# Patient Record
Sex: Male | Born: 1960 | Race: White | Hispanic: No | Marital: Married | Smoking: Never smoker
Health system: Southern US, Community
[De-identification: ages and names within clinical notes are randomized; demographics above are authoritative.]

## PROBLEM LIST (undated history)

## (undated) DIAGNOSIS — I2699 Other pulmonary embolism without acute cor pulmonale: Secondary | ICD-10-CM

## (undated) DIAGNOSIS — F32A Depression, unspecified: Secondary | ICD-10-CM

## (undated) DIAGNOSIS — I2510 Atherosclerotic heart disease of native coronary artery without angina pectoris: Secondary | ICD-10-CM

## (undated) DIAGNOSIS — G2581 Restless legs syndrome: Secondary | ICD-10-CM

## (undated) HISTORY — DX: Other pulmonary embolism without acute cor pulmonale: I26.99

## (undated) HISTORY — DX: Atherosclerotic heart disease of native coronary artery without angina pectoris: I25.10

---

## 2012-04-15 DIAGNOSIS — F419 Anxiety disorder, unspecified: Secondary | ICD-10-CM | POA: Diagnosis present

## 2014-03-23 DIAGNOSIS — Z832 Family history of diseases of the blood and blood-forming organs and certain disorders involving the immune mechanism: Secondary | ICD-10-CM

## 2015-05-10 DIAGNOSIS — G609 Hereditary and idiopathic neuropathy, unspecified: Secondary | ICD-10-CM | POA: Diagnosis present

## 2017-08-01 DIAGNOSIS — D126 Benign neoplasm of colon, unspecified: Secondary | ICD-10-CM | POA: Insufficient documentation

## 2018-07-25 DIAGNOSIS — E785 Hyperlipidemia, unspecified: Secondary | ICD-10-CM | POA: Diagnosis present

## 2020-02-10 DIAGNOSIS — N400 Enlarged prostate without lower urinary tract symptoms: Secondary | ICD-10-CM | POA: Insufficient documentation

## 2021-09-21 ENCOUNTER — Encounter
Admission: EM | Disposition: A | Discharge status: Home / Self Care | Payer: Self-pay | Source: Home / Self Care | Attending: Osteopathic Medicine

## 2021-09-21 ENCOUNTER — Inpatient Hospital Stay
Admission: EM | Admit: 2021-09-21 | Discharge: 2021-09-22 | DRG: 164 | Disposition: A | Discharge status: Home / Self Care | Payer: BC Managed Care – PPO | Attending: Internal Medicine | Admitting: Internal Medicine

## 2021-09-21 ENCOUNTER — Emergency Department: Payer: BC Managed Care – PPO

## 2021-09-21 ENCOUNTER — Encounter: Payer: Self-pay | Admitting: Emergency Medicine

## 2021-09-21 ENCOUNTER — Other Ambulatory Visit: Payer: Self-pay

## 2021-09-21 DIAGNOSIS — F419 Anxiety disorder, unspecified: Secondary | ICD-10-CM | POA: Diagnosis present

## 2021-09-21 DIAGNOSIS — I2602 Saddle embolus of pulmonary artery with acute cor pulmonale: Principal | ICD-10-CM | POA: Diagnosis present

## 2021-09-21 DIAGNOSIS — R778 Other specified abnormalities of plasma proteins: Secondary | ICD-10-CM

## 2021-09-21 DIAGNOSIS — I2699 Other pulmonary embolism without acute cor pulmonale: Secondary | ICD-10-CM | POA: Diagnosis not present

## 2021-09-21 DIAGNOSIS — Z79899 Other long term (current) drug therapy: Secondary | ICD-10-CM | POA: Diagnosis not present

## 2021-09-21 DIAGNOSIS — F3342 Major depressive disorder, recurrent, in full remission: Secondary | ICD-10-CM | POA: Diagnosis not present

## 2021-09-21 DIAGNOSIS — Z20822 Contact with and (suspected) exposure to covid-19: Secondary | ICD-10-CM | POA: Diagnosis present

## 2021-09-21 DIAGNOSIS — E875 Hyperkalemia: Secondary | ICD-10-CM

## 2021-09-21 DIAGNOSIS — Z832 Family history of diseases of the blood and blood-forming organs and certain disorders involving the immune mechanism: Secondary | ICD-10-CM | POA: Diagnosis not present

## 2021-09-21 DIAGNOSIS — R55 Syncope and collapse: Secondary | ICD-10-CM | POA: Diagnosis present

## 2021-09-21 DIAGNOSIS — E785 Hyperlipidemia, unspecified: Secondary | ICD-10-CM | POA: Diagnosis present

## 2021-09-21 DIAGNOSIS — G9009 Other idiopathic peripheral autonomic neuropathy: Secondary | ICD-10-CM

## 2021-09-21 DIAGNOSIS — G608 Other hereditary and idiopathic neuropathies: Secondary | ICD-10-CM | POA: Diagnosis present

## 2021-09-21 DIAGNOSIS — G2581 Restless legs syndrome: Secondary | ICD-10-CM | POA: Diagnosis present

## 2021-09-21 DIAGNOSIS — R030 Elevated blood-pressure reading, without diagnosis of hypertension: Secondary | ICD-10-CM | POA: Diagnosis present

## 2021-09-21 DIAGNOSIS — I248 Other forms of acute ischemic heart disease: Secondary | ICD-10-CM | POA: Diagnosis present

## 2021-09-21 DIAGNOSIS — G609 Hereditary and idiopathic neuropathy, unspecified: Secondary | ICD-10-CM | POA: Diagnosis present

## 2021-09-21 DIAGNOSIS — R748 Abnormal levels of other serum enzymes: Secondary | ICD-10-CM

## 2021-09-21 DIAGNOSIS — F32A Depression, unspecified: Secondary | ICD-10-CM | POA: Diagnosis present

## 2021-09-21 HISTORY — DX: Restless legs syndrome: G25.81

## 2021-09-21 HISTORY — PX: PULMONARY THROMBECTOMY: CATH118295

## 2021-09-21 HISTORY — DX: Depression, unspecified: F32.A

## 2021-09-21 LAB — CBC
HCT: 42 % (ref 39.0–52.0)
Hemoglobin: 14.2 g/dL (ref 13.0–17.0)
MCH: 28.7 pg (ref 26.0–34.0)
MCHC: 33.8 g/dL (ref 30.0–36.0)
MCV: 85 fL (ref 80.0–100.0)
Platelets: 204 10*3/uL (ref 150–400)
RBC: 4.94 MIL/uL (ref 4.22–5.81)
RDW: 11.7 % (ref 11.5–15.5)
WBC: 11.5 10*3/uL — ABNORMAL HIGH (ref 4.0–10.5)
nRBC: 0 % (ref 0.0–0.2)

## 2021-09-21 LAB — HIV ANTIBODY (ROUTINE TESTING W REFLEX): HIV Screen 4th Generation wRfx: NONREACTIVE

## 2021-09-21 LAB — COMPREHENSIVE METABOLIC PANEL
ALT: 48 U/L — ABNORMAL HIGH (ref 0–44)
AST: 48 U/L — ABNORMAL HIGH (ref 15–41)
Albumin: 4.2 g/dL (ref 3.5–5.0)
Alkaline Phosphatase: 72 U/L (ref 38–126)
Anion gap: 8 (ref 5–15)
BUN: 15 mg/dL (ref 8–23)
CO2: 22 mmol/L (ref 22–32)
Calcium: 8.7 mg/dL — ABNORMAL LOW (ref 8.9–10.3)
Chloride: 108 mmol/L (ref 98–111)
Creatinine, Ser: 0.77 mg/dL (ref 0.61–1.24)
GFR, Estimated: >60 mL/min (ref >60)
Glucose, Bld: 128 mg/dL — ABNORMAL HIGH (ref 70–99)
Potassium: 4.3 mmol/L (ref 3.5–5.1)
Sodium: 138 mmol/L (ref 135–145)
Total Bilirubin: 0.9 mg/dL (ref 0.3–1.2)
Total Protein: 7.2 g/dL (ref 6.5–8.1)

## 2021-09-21 LAB — TROPONIN I (HIGH SENSITIVITY)
Troponin I (High Sensitivity): 317 ng/L (ref <18)
Troponin I (High Sensitivity): 1698 ng/L (ref <18)

## 2021-09-21 LAB — PROTIME-INR
INR: 1.1 (ref 0.8–1.2)
Prothrombin Time: 13.7 seconds (ref 11.4–15.2)

## 2021-09-21 LAB — RESP PANEL BY RT-PCR (FLU A&B, COVID) ARPGX2
Influenza A by PCR: NEGATIVE
Influenza B by PCR: NEGATIVE
SARS Coronavirus 2 by RT PCR: NEGATIVE

## 2021-09-21 LAB — APTT
aPTT: 22 seconds — ABNORMAL LOW (ref 24–36)
aPTT: 82 seconds — ABNORMAL HIGH (ref 24–36)

## 2021-09-21 LAB — HEPARIN LEVEL (UNFRACTIONATED): Heparin Unfractionated: 0.24 IU/mL — ABNORMAL LOW (ref 0.30–0.70)

## 2021-09-21 SURGERY — PULMONARY THROMBECTOMY
Anesthesia: Moderate Sedation | Laterality: Bilateral

## 2021-09-21 MED ORDER — SODIUM CHLORIDE 0.9 % IV BOLUS
1000.0000 mL | Freq: Once | INTRAVENOUS | Status: AC
Start: 2021-09-21 — End: 2021-09-21
  Administered 2021-09-21: 1000 mL via INTRAVENOUS

## 2021-09-21 MED ORDER — PAROXETINE HCL 30 MG PO TABS
30.0000 mg | ORAL_TABLET | Freq: Every day | ORAL | Status: DC
  Administered 2021-09-22: 30 mg via ORAL
  Filled 2021-09-21: qty 1

## 2021-09-21 MED ORDER — CEFAZOLIN SODIUM-DEXTROSE 2-4 GM/100ML-% IV SOLN
INTRAVENOUS | Status: AC
  Administered 2021-09-21: 2 g via INTRAVENOUS
  Filled 2021-09-21: qty 100

## 2021-09-21 MED ORDER — SODIUM CHLORIDE 0.9 % IV SOLN: INTRAVENOUS | Status: DC

## 2021-09-21 MED ORDER — IOHEXOL 350 MG/ML SOLN
75.0000 mL | Freq: Once | INTRAVENOUS | Status: AC | PRN
  Administered 2021-09-21: 75 mL via INTRAVENOUS

## 2021-09-21 MED ORDER — FENTANYL CITRATE (PF) 100 MCG/2ML IJ SOLN
INTRAMUSCULAR | Status: DC | PRN
  Administered 2021-09-21: 50 ug via INTRAVENOUS

## 2021-09-21 MED ORDER — MIDAZOLAM HCL 5 MG/5ML IJ SOLN
INTRAMUSCULAR | Status: AC
  Filled 2021-09-21: qty 5

## 2021-09-21 MED ORDER — MIDAZOLAM HCL 2 MG/2ML IJ SOLN
INTRAMUSCULAR | Status: DC | PRN
  Administered 2021-09-21: 2 mg via INTRAVENOUS

## 2021-09-21 MED ORDER — FENTANYL CITRATE PF 50 MCG/ML IJ SOSY
PREFILLED_SYRINGE | INTRAMUSCULAR | Status: AC
  Filled 2021-09-21: qty 1

## 2021-09-21 MED ORDER — HEPARIN BOLUS VIA INFUSION
6000.0000 [IU] | Freq: Once | INTRAVENOUS | Status: AC
  Administered 2021-09-21: 6000 [IU] via INTRAVENOUS
  Filled 2021-09-21: qty 6000

## 2021-09-21 MED ORDER — ALTEPLASE 2 MG IJ SOLR
INTRAMUSCULAR | Status: DC | PRN
  Administered 2021-09-21: 8 mg

## 2021-09-21 MED ORDER — PRAMIPEXOLE DIHYDROCHLORIDE 0.25 MG PO TABS
0.5000 mg | ORAL_TABLET | Freq: Every day | ORAL | Status: DC
  Administered 2021-09-21 – 2021-09-22 (×2): 0.5 mg via ORAL
  Filled 2021-09-21 (×2): qty 2

## 2021-09-21 MED ORDER — IODIXANOL 320 MG/ML IV SOLN
INTRAVENOUS | Status: DC | PRN
  Administered 2021-09-21: 70 mL

## 2021-09-21 MED ORDER — HEPARIN (PORCINE) 25000 UT/250ML-% IV SOLN
1650.0000 [IU]/h | INTRAVENOUS | Status: DC
  Administered 2021-09-21: 1600 [IU]/h via INTRAVENOUS
  Administered 2021-09-22: 1650 [IU]/h via INTRAVENOUS
  Filled 2021-09-21 (×2): qty 250

## 2021-09-21 MED ORDER — HEPARIN SODIUM (PORCINE) 1000 UNIT/ML IJ SOLN
INTRAMUSCULAR | Status: AC
  Filled 2021-09-21: qty 10

## 2021-09-21 MED ORDER — CEFAZOLIN SODIUM-DEXTROSE 2-4 GM/100ML-% IV SOLN: 2.0000 g | INTRAVENOUS | Status: DC

## 2021-09-21 MED ORDER — SODIUM CHLORIDE 0.9 % IV BOLUS
250.0000 mL | Freq: Once | INTRAVENOUS | Status: AC
  Administered 2021-09-21: 250 mL via INTRAVENOUS

## 2021-09-21 MED ORDER — PRAMIPEXOLE DIHYDROCHLORIDE 0.25 MG PO TABS
0.5000 mg | ORAL_TABLET | Freq: Every evening | ORAL | Status: DC | PRN
  Filled 2021-09-21: qty 2

## 2021-09-21 MED ORDER — ALTEPLASE 2 MG IJ SOLR
INTRAMUSCULAR | Status: AC
  Filled 2021-09-21: qty 8

## 2021-09-21 SURGICAL SUPPLY — 14 items
CANISTER PENUMBRA ENGINE (MISCELLANEOUS) ×1 IMPLANT
CATH INDIGO 12 HTORQ 115 (CATHETERS) ×1 IMPLANT
CATH INDIGO SEP 12 (CATHETERS) ×1 IMPLANT
CATH INFINITI JR4 5F (CATHETERS) ×1 IMPLANT
CATH SELECT BERN TIP 5F 130 (CATHETERS) ×1 IMPLANT
COVER PROBE U/S 5X48 (MISCELLANEOUS) ×1 IMPLANT
DEVICE TORQUE .025-.038 (MISCELLANEOUS) ×1 IMPLANT
GLIDEWIRE ADV .035X260CM (WIRE) ×1 IMPLANT
PACK ANGIOGRAPHY (CUSTOM PROCEDURE TRAY) ×2 IMPLANT
SHEATH BRITE TIP 8FRX11 (SHEATH) ×1 IMPLANT
SHEATH PINNACLE 11FRX10 (SHEATH) ×1 IMPLANT
SYR MEDRAD MARK 7 150ML (SYRINGE) ×1 IMPLANT
TUBING CONTRAST HIGH PRESS 72 (TUBING) ×1 IMPLANT
WIRE GUIDERIGHT .035X150 (WIRE) ×1 IMPLANT

## 2021-09-21 NOTE — Consult Note (Signed)
?[facility] ?Vascular Consult Note ? ?MRN : [id number] ? ?Roberto Pope is a 61 y.o. ([birth year]) male who presents with chief complaint of  ?Chief Complaint  ?Patient presents with  ? Near Syncope  ? Shortness of Breath  ?. ? ?History of Present Illness: Patient presents with near syncope and significant shortness of breath that occurred today.  He has been having some leg pain and thought it was a pulled muscle for about 3 to 4 days.  He reports mild shortness of breath over months but nothing severe until today when he began having significant chest pain and shortness of breath that was new.  He denies any bleeding issues.  He does have family members who have had pulmonary embolism blood clots in the past.  He denies any trauma or injury or inciting event.  I have independently reviewed his CT angiogram and he has a submassive pulmonary embolus with right heart strain, saddle embolus with involvement of both main pulmonary arteries and the primary lobar branches.  He remains tachycardic and mildly short of breath on supplemental oxygen ? ?Current Facility-Administered Medications  ?Medication Dose Route Frequency Provider Last Rate Last Admin  ? ceFAZolin (ANCEF) 2-4 GM/100ML-% IVPB           ? fentaNYL (SUBLIMAZE) 50 MCG/ML injection           ? heparin sodium (porcine) 1000 UNIT/ML injection           ? midazolam (VERSED) 5 MG/5ML injection           ? 0.9 %  sodium chloride infusion   Intravenous Continuous Aleksey Newbern, Erskine Squibb, MD      ? ceFAZolin (ANCEF) IVPB 2g/100 mL premix  2 g Intravenous 30 min Pre-Op Algernon Huxley, MD      ? heparin ADULT infusion 100 units/mL (25000 units/22m)  1,600 Units/hr Intravenous Continuous POswald Hillock RPH 16 mL/hr at 09/21/21 1435 1,600 Units/hr at 09/21/21 1435  ? [MAR Hold] PARoxetine (PAXIL) tablet 30 mg  30 mg Oral Daily AEmeterio Reeve DO      ? [MAR Hold] pramipexole (MIRAPEX) tablet 0.5 mg  0.5 mg Oral Daily AEmeterio Reeve DO   0.5  mg at 09/21/21 1607  ? [MAR Hold] pramipexole (MIRAPEX) tablet 0.5 mg  0.5 mg Oral QHS PRN AEmeterio Reeve DO      ? ? ?Past Medical History:  ?Diagnosis Date  ? Depression   ? Restless leg   ? ? ? ? ?Social History  ? ?Tobacco Use  ? Smoking status: Unknown  ?Substance Use Topics  ? Alcohol use: Not Currently  ? Drug use: Never  ? ? ? ?Family History  ?Problem Relation Age of Onset  ? Pulmonary embolism Mother   ? Pulmonary embolism Nephew   ?No AAA ?No bleeding disorders ? ?No Known Allergies ? ? ?REVIEW OF SYSTEMS (Negative unless checked) ? ?Constitutional: '[]'$ Weight loss  '[]'$ Fever  '[]'$ Chills ?Cardiac: '[]'$ Chest pain   '[]'$ Chest pressure   '[]'$ Palpitations   '[]'$ Shortness of breath when laying flat   '[x]'$ Shortness of breath at rest   '[x]'$ Shortness of breath with exertion. ?Vascular:  '[x]'$ Pain in legs with walking   '[]'$ Pain in legs at rest   '[]'$ Pain in legs when laying flat   '[]'$ Claudication   '[]'$ Pain in feet when walking  '[]'$ Pain in feet at rest  '[]'$ Pain in feet when laying flat   '[x]'$ History of DVT   '[]'$ Phlebitis   '[]'$ Swelling in legs   '[]'$ Varicose veins   '[]'$   Non-healing ulcers ?Pulmonary:   '[]'$ Uses home oxygen   '[]'$ Productive cough   '[]'$ Hemoptysis   '[]'$ Wheeze  '[]'$ COPD   '[]'$ Asthma ?Neurologic:  '[]'$ Dizziness  '[]'$ Blackouts   '[]'$ Seizures   '[]'$ History of stroke   '[]'$ History of TIA  '[]'$ Aphasia   '[]'$ Temporary blindness   '[]'$ Dysphagia   '[]'$ Weakness or numbness in arms   '[]'$ Weakness or numbness in legs ?Musculoskeletal:  '[]'$ Arthritis   '[]'$ Joint swelling   '[]'$ Joint pain   '[]'$ Low back pain ?Hematologic:  '[]'$ Easy bruising  '[]'$ Easy bleeding   '[]'$ Hypercoagulable state   '[]'$ Anemic  '[]'$ Hepatitis ?Gastrointestinal:  '[]'$ Blood in stool   '[]'$ Vomiting blood  '[]'$ Gastroesophageal reflux/heartburn   '[]'$ Difficulty swallowing. ?Genitourinary:  '[]'$ Chronic kidney disease   '[]'$ Difficult urination  '[]'$ Frequent urination  '[]'$ Burning with urination   '[]'$ Blood in urine ?Skin:  '[]'$ Rashes   '[]'$ Ulcers   '[]'$ Wounds ?Psychological:  '[x]'$ History of anxiety   '[]'$  History of major depression. ? ?Physical  Examination ? ?Vitals:  ? 09/21/21 1500 09/21/21 1530 09/21/21 1600 09/21/21 1633  ?BP: (!) 140/117 (!) 153/126 (!) 157/92 (!) 135/103  ?Pulse: (!) 122 (!) 124 (!) 146 (!) 125  ?Resp: (!) 25 19 (!) 31 (!) 26  ?Temp:    97.6 ?F (36.4 ?C)  ?TempSrc:    Oral  ?SpO2: 98% 98% 96% 96%  ?Weight:    101.6 kg  ?Height:    '6\' 2"'$  (1.88 m)  ? ?Body mass index is 28.76 kg/m?. ?Gen:  WD/WN, NAD ?Head: [place]/AT, No temporalis wasting.  ?Ear/Nose/Throat: Hearing grossly intact, nares w/o erythema or drainage, oropharynx w/o Erythema/Exudate ?Eyes: Sclera non-icteric, conjunctiva clear ?Neck: Trachea midline.  No JVD.  ?Pulmonary:  Good air movement, respirations mildly labored on supplemental oxygen ?Cardiac: tachycardic but regular ?Vascular:  ?Vessel Right Left  ?Radial Palpable Palpable  ? ?Musculoskeletal: M/S 5/5 throughout.  Extremities without ischemic changes.  No deformity or atrophy. No edema. ?Neurologic: Sensation grossly intact in extremities.  Symmetrical.  Speech is fluent. Motor exam as listed above. ?Psychiatric: Judgment intact, Mood & affect appropriate for pt's clinical situation. ?Dermatologic: No rashes or ulcers noted.  No cellulitis or open wounds. ? ? ? ? ? ?CBC ?Lab Results  ?Component Value Date  ? WBC 11.5 (H) 09/21/2021  ? HGB 14.2 09/21/2021  ? HCT 42.0 09/21/2021  ? MCV 85.0 09/21/2021  ? PLT 204 09/21/2021  ? ? ?BMET ?   ?Component Value Date/Time  ? NA 138 09/21/2021 1228  ? K 4.3 09/21/2021 1228  ? CL 108 09/21/2021 1228  ? CO2 22 09/21/2021 1228  ? GLUCOSE 128 (H) 09/21/2021 1228  ? BUN 15 09/21/2021 1228  ? CREATININE 0.77 09/21/2021 1228  ? CALCIUM 8.7 (L) 09/21/2021 1228  ? GFRNONAA >60 09/21/2021 1228  ? ?Estimated Creatinine Clearance: 121.9 mL/min (by C-G formula based on SCr of 0.77 mg/dL). ? ?COAG ?Lab Results  ?Component Value Date  ? INR 1.1 09/21/2021  ? ? ?Radiology ?CT Angio Chest PE W and/or Wo Contrast ? ?Result Date: 09/21/2021 ?CLINICAL DATA:  Dizziness and nausea with standing.  Back  pain. EXAM: CT ANGIOGRAPHY CHEST WITH CONTRAST TECHNIQUE: Multidetector CT imaging of the chest was performed using the standard protocol during bolus administration of intravenous contrast. Multiplanar CT image reconstructions and MIPs were obtained to evaluate the vascular anatomy. RADIATION DOSE REDUCTION: This exam was performed according to the departmental dose-optimization program which includes automated exposure control, adjustment of the mA and/or kV according to patient size and/or use of iterative reconstruction technique. CONTRAST:  60m OMNIPAQUE IOHEXOL 350 MG/ML  SOLN COMPARISON:  None. FINDINGS: Cardiovascular: Saddle embolus with extensive clot extending to the lobar, segmental and subsegmental levels bilaterally. RV/LV ratio of 2.6. Venous air, likely related to contrast injection. Heart is at the upper limits of normal in size. No pericardial effusion. Mediastinum/Nodes: Mediastinal lymph nodes measure up to 1.4 cm in the low right paratracheal station. No hilar or axillary adenopathy. Esophagus is grossly unremarkable. Lungs/Pleura: Rounded subpleural ground-glass and consolidation in the posterolateral left lower lobe. Minimal subpleural atelectasis in the lingula and left lower lobe. Trace left pleural effusion. Airway is unremarkable. Upper Abdomen: Visualized portions of the liver, adrenal glands, kidneys, spleen, pancreas, stomach and bowel are unremarkable with exception of a small hiatal hernia. Musculoskeletal: Degenerative changes in the spine. No worrisome lytic or sclerotic lesions. Review of the MIP images confirms the above findings. IMPRESSION: 1. Positive for acute PE with CT evidence of right heart strain (RV/LV Ratio = 2.6) consistent with at least submassive (intermediate risk) PE. The presence of right heart strain has been associated with an increased risk of morbidity and mortality. Please refer to the "PE Focused" order set in EPIC. Critical Value/emergent results were  called by telephone at the time of interpretation on 09/21/2021 at 1:56 pm to provider [facility] , who verbally acknowledged these results. 2. Small infarct in the posterolateral left lower lobe. 3. Trace

## 2021-09-21 NOTE — Assessment & Plan Note (Addendum)
Continue home medications with Paxil ?

## 2021-09-21 NOTE — Assessment & Plan Note (Signed)
Minimal, follow-up outpatient ?

## 2021-09-21 NOTE — H&P (Signed)
? ? ?HISTORY AND PHYSICAL ? ?Patient: Roberto Pope 61 y.o. male ?MRN: [id number] ? ?Today is hospital day 0 after presenting to ED on 09/21/2021 12:24 PM with  ?Chief Complaint  ?Patient presents with  ? Near Syncope  ? Shortness of Breath  ? ? ? ?RECORD REVIEW AND HOSPITAL COURSE: ?From ED notes 09/21/21 "Roberto Pope is a 61 y.o. male with a past medical history of depression who presents to the emergency department for back pain and shortness of breath.  According to the patient since Sunday he has been experiencing pain in his back worse when he takes a deep breath and a sensation like he cannot get a full deep breath of air.  He states today he was sitting and when he stood up he had a sharp pain in his back felt short of breath and felt lightheaded like he was going to pass out.  Patient was able to get himself seated once again and did not actually lose consciousness.  Did not hit his head.  Here the patient noted to be tachycardic around 130.  Borderline hypoxic in the upper 80s/low 90s placed on 2 L nasal cannula oxygen with no baseline O2 requirement.  Patient denies any recent cough or fever.  Denies any leg pain or swelling.  No history of cardiac disease or pulmonary emboli/DVT.  No blood thinners." ?Emergency department course 09/21/2021: Slightly elevated blood pressure, tachycardic, slight increased respiratory rate, afebrile, CBC slightly elevated 11.5, CT angiogram chest showed acute PE, submassive, saddle embolus, associated with right heart strain as well as small infarct in posterolateral left lower lobe and trace pleural effusion. ? ?Procedures and Significant Results:  ?CT angiogram chest 09/21/21 showed acute PE, submassive, saddle embolus, associated with right heart strain as well as small infarct in posterolateral left lower lobe and trace pleural effusion. ?Plan for OR today 09/21/21 w/ vascular surgery  ? ?Consultants:  ?Vascular surgery, Dr. Lucky Cowboy ? ? ? ?SUBJECTIVE:  ?Patient seen and  examined lying comfortably in bed in emergency department, no apparent distress.  He is in good spirits.  He confirms the above history.  He notes also that he and his wife were recently on a train ride to [place], returned back on March 25 (11 days ago), he notes that the return trip on the train was several hours longer than the trip there.  He has not noted any lower extremity swelling, he notes he has been rubbing his legs more often than usual so he may be a bit more sore, he has a history of idiopathic lower extremity neuropathy.  He reports family history of factor V Leiden but he states he has tested negative for this disorder in the past. ? ? ? ? ?ASSESSMENT & PLAN ? ?Pulmonary embolus and infarction [facility]) see CT 09/21/2021, (+)saddle embolus w/ R heart strain and LLL infarction ?Possibly provoked by recent travel, long train ride from [place] to [place] 11 days prior to presentation to the ED  ?Heparin bolus and GTT initiated in ED ?Vascular surgery to take today for thrombectomy ? ?Family history of factor V Leiden mutation ?Patient reports previously tested negative ?Family history of mother with PE at age 50, nephew with PE at age 34 ?Will repeat testing for this and other hypercoagulable disorders ? ?Anxiety ?Continue home medications with Paxil ? ?Idiopathic peripheral neuropathy ?Stable, continue home medications with pramipexole ? ?Hyperlipidemia ?No home meds, may contribute to cardiac risk  ?lipid panel in a.m. ? ?Elevated liver  enzymes ?Minimal, follow-up outpatient ? ? ? ?VTE Ppx: Currently on active treatment for PE ?CODE STATUS: FULL ?Admitted from: home ?Expected Dispo: home ?Barriers to discharge: Continued medical treatment for PE, vascular surgery planning for procedure ?Family communication: wife was at bedside in emergency department ? ? ? ? ? ? ? ? ? ? ? ? ? ?Past Medical History:  ?Diagnosis Date  ? Depression   ? Restless leg   ? ? ?Family History  ?Problem Relation  Age of Onset  ? Pulmonary embolism Mother   ? Pulmonary embolism Nephew   ? ?Social History:  has no history on file for tobacco use, alcohol use, and drug use. ? ?Allergies: No Known Allergies ? ?No current facility-administered medications on file prior to encounter.  ? ?Current Outpatient Medications on File Prior to Encounter  ?Medication Sig Dispense Refill  ? PARoxetine (PAXIL) 30 MG tablet Take 30 mg by mouth daily.    ? pramipexole (MIRAPEX) 0.5 MG tablet Take 0.5 mg by mouth daily after lunch.    ? ? ? ?Results for orders placed or performed during the hospital encounter of 09/21/21 (from the past 48 hour(s))  ?CBC     Status: Abnormal  ? Collection Time: 09/21/21 12:28 PM  ?Result Value Ref Range  ? WBC 11.5 (H) 4.0 - 10.5 K/uL  ? RBC 4.94 4.22 - 5.81 MIL/uL  ? Hemoglobin 14.2 13.0 - 17.0 g/dL  ? HCT 42.0 39.0 - 52.0 %  ? MCV 85.0 80.0 - 100.0 fL  ? MCH 28.7 26.0 - 34.0 pg  ? MCHC 33.8 30.0 - 36.0 g/dL  ? RDW 11.7 11.5 - 15.5 %  ? Platelets 204 150 - 400 K/uL  ? nRBC 0.0 0.0 - 0.2 %  ?  Comment: Performed at [facility], [address], [place], [place] [address]  ?Comprehensive metabolic panel     Status: Abnormal  ? Collection Time: 09/21/21 12:28 PM  ?Result Value Ref Range  ? Sodium 138 135 - 145 mmol/L  ? Potassium 4.3 3.5 - 5.1 mmol/L  ? Chloride 108 98 - 111 mmol/L  ? CO2 22 22 - 32 mmol/L  ? Glucose, Bld 128 (H) 70 - 99 mg/dL  ?  Comment: Glucose reference range applies only to samples taken after fasting for at least 8 hours.  ? BUN 15 8 - 23 mg/dL  ? Creatinine, Ser 0.77 0.61 - 1.24 mg/dL  ? Calcium 8.7 (L) 8.9 - 10.3 mg/dL  ? Total Protein 7.2 6.5 - 8.1 g/dL  ? Albumin 4.2 3.5 - 5.0 g/dL  ? AST 48 (H) 15 - 41 U/L  ? ALT 48 (H) 0 - 44 U/L  ? Alkaline Phosphatase 72 38 - 126 U/L  ? Total Bilirubin 0.9 0.3 - 1.2 mg/dL  ? GFR, Estimated >60 >60 mL/min  ?  Comment: (NOTE) ?Calculated using the CKD-EPI Creatinine Equation (2021) ?  ? Anion gap 8 5 - 15  ?  Comment: Performed at [facility], [address] [place], [place] [address]  ?Troponin I (High Sensitivity)     Status: Abnormal  ? Collection Time: 09/21/21 12:28 PM  ?Result Value Ref Range  ? Troponin I (High Sensitivity) 317 (HH) <18 ng/L  ?  Comment: CRITICAL RESULT CALLED TO, READ BACK BY AND VERIFIED WITH ?Carroll Kinds RN [id number] 09/21/21 HNM ?(NOTE) ?Elevated high sensitivity troponin I (hsTnI) values and significant  ?changes across serial measurements may suggest ACS but many other  ?chronic and  acute conditions are known to elevate hsTnI results.  ?Refer to the "Links" section for chest pain algorithms and additional  ?guidance. ?Performed at [facility], [place], ?[place] [address] ?  ? ?CT Angio Chest PE W and/or Wo Contrast ? ?Result Date: 09/21/2021 ?CLINICAL DATA:  Dizziness and nausea with standing.  Back pain. EXAM: CT ANGIOGRAPHY CHEST WITH CONTRAST TECHNIQUE: Multidetector CT imaging of the chest was performed using the standard protocol during bolus administration of intravenous contrast. Multiplanar CT image reconstructions and MIPs were obtained to evaluate the vascular anatomy. RADIATION DOSE REDUCTION: This exam was performed according to the departmental dose-optimization program which includes automated exposure control, adjustment of the mA and/or kV according to patient size and/or use of iterative reconstruction technique. CONTRAST:  69m OMNIPAQUE IOHEXOL 350 MG/ML SOLN COMPARISON:  None. FINDINGS: Cardiovascular: Saddle embolus with extensive clot extending to the lobar, segmental and subsegmental levels bilaterally. RV/LV ratio of 2.6. Venous air, likely related to contrast injection. Heart is at the upper limits of normal in size. No pericardial effusion. Mediastinum/Nodes: Mediastinal lymph nodes measure up to 1.4 cm in the low right paratracheal station. No hilar or axillary adenopathy. Esophagus is grossly unremarkable. Lungs/Pleura: Rounded subpleural ground-glass and  consolidation in the posterolateral left lower lobe. Minimal subpleural atelectasis in the lingula and left lower lobe. Trace left pleural effusion. Airway is unremarkable. Upper Abdomen: Visualized portions

## 2021-09-21 NOTE — Assessment & Plan Note (Addendum)
Stable,

## 2021-09-21 NOTE — Assessment & Plan Note (Addendum)
No home meds.  Patient will need to follow-up with PCP in 1 week.  I advised him to go back to his PCP in [place] for now, and the be seen in 1 week.  He can establish PCP in [place] at a later date. ?

## 2021-09-21 NOTE — Consult Note (Signed)
ANTICOAGULATION CONSULT NOTE  ? ?Pharmacy Consult for Heparin  ?Indication: pulmonary embolus ? ?No Known Allergies ? ?Patient Measurements: ?Height: '6\' 2"'$  (188 cm) ?Weight: 101.6 kg (223 lb 15.8 oz) ?IBW/kg (Calculated) : 82.2 ?Heparin Dosing Weight: 101.6 kg ? ?Vital Signs: ?Temp: 98.3 ?F (36.8 ?C) (04/05 1939) ?Temp Source: Oral (04/05 1633) ?BP: 125/93 (04/05 1939) ?Pulse Rate: 96 (04/05 1939) ? ?Labs: ?Recent Labs  ?  09/21/21 ?1228 09/21/21 ?1343 09/21/21 ?1428 09/21/21 ?2117  ?HGB 14.2  --   --   --   ?HCT 42.0  --   --   --   ?PLT 204  --   --   --   ?APTT  --  22*  --   --   ?LABPROT  --  13.7  --   --   ?INR  --  1.1  --   --   ?HEPARINUNFRC  --   --   --  0.24*  ?CREATININE 0.77  --   --   --   ?TROPONINIHS 317*  --  1,698*  --   ? ? ? ?Estimated Creatinine Clearance: 121.9 mL/min (by C-G formula based on SCr of 0.77 mg/dL). ? ? ?Medical History: ?Past Medical History:  ?Diagnosis Date  ? Depression   ? Restless leg   ? ? ?Medications:  ?Medications Prior to Admission  ?Medication Sig Dispense Refill Last Dose  ? PARoxetine (PAXIL) 30 MG tablet Take 30 mg by mouth daily.   09/21/2021 at 0800  ? pramipexole (MIRAPEX) 0.5 MG tablet Take 0.5 mg by mouth daily after lunch.   09/20/2021 at 1300  ? ?Scheduled:  ?Infusions:  ?PRN:  ?Anti-infectives (From admission, onward)  ? ? Start     Dose/Rate Route Frequency Ordered Stop  ? 09/21/21 1414  ceFAZolin (ANCEF) IVPB 2g/100 mL premix  Status:  Discontinued       ? 2 g ?200 mL/hr over 30 Minutes Intravenous 30 min pre-op 09/21/21 1414 09/21/21 1749  ? ?  ? ? ?Assessment: ?Pharmacy consulted to start heparin for PE. No imaging yet. CBC stable. No DOAC PTA. Pt complaint of near syncope and SOB. Baseline labs ordered.  ? ? ? ?Goal of Therapy:  ?Heparin level 0.3-0.7 units/ml ?Monitor platelets by anticoagulation protocol: Yes ?  ?Plan:  ?4/5 '@2117'$  HL=0.24 *heparin drip stopped from 1625 to 1810 for thromobectomy procedure, received tPA. (Level only ~3 hrs after  restart) ?- will slightly increase drip from 1600 to 1650 units/hr ?Will order HL in 6 hrs  ?CBC daily per protocol ? ?Noralee Space, PharmD ?09/21/2021,10:08 PM ? ? ?

## 2021-09-21 NOTE — ED Triage Notes (Signed)
Pt bib ems was sitting outside and when he went to stand up felt dizzyand nauseated. Pt had pain in back that started Sunday and since then has not been able to get a good deep breath.  ?

## 2021-09-21 NOTE — Op Note (Signed)
[facility] ? Percutaneous Study/Intervention Procedural Note ? ? ?Date of Surgery: 09/21/2021,5:30 PM ? ?Surgeon: Leotis Pain ? ?Pre-operative Diagnosis: Symptomatic bilateral pulmonary emboli ? ?Post-operative diagnosis:  Same ? ?Procedure(s) Performed: ? 1.  Contrast injection right heart ? 2.  Thrombolysis with 8 mg of tPA, 4 mg in each of the main pulmonary arteries ? 3.  Mechanical thrombectomy left main, left lower lobe, and left upper lobe pulmonary arteries as well as the right main, right lower lobe, right middle lobe, and right upper lobe pulmonary arteries  ? 4.  Selective catheter placement right lower lobe, middle lobe, and upper lobe pulmonary artery ? 5.  Selective catheter placement left lower lobe and upper lobe pulmonary artery ?  ? ?Anesthesia: Conscious sedation was administered under my direct supervision by the interventional radiology RN. IV Versed plus fentanyl were utilized. Continuous ECG, pulse oximetry and blood pressure was monitored throughout the entire procedure.  Versed and fentanyl were administered intravenously.  Conscious sedation was administered for a total of 37 minutes using 2 of Versed and 50 mcg of Fentanyl. ? ?EBL: 300 cc ? ?Sheath: 6 French right femoral vein ? ?Contrast: 70 cc  ? ?Fluoroscopy Time: 11.9 minutes ? ?Indications:  Patient presents with pulmonary emboli. The patient is symptomatic with hypoxemia and dyspnea on exertion.  There is evidence of right heart strain on the CT angiogram. The patient is otherwise a good candidate for intervention and even the long-term benefits pulmonary angiography with thrombolysis is offered. The risks and benefits are reviewed long-term benefits are discussed. All questions are answered patient agrees to proceed. ? ?Procedure:  Brentin Shin a 61 y.o. male who was identified and appropriate procedural time out was performed.  The patient was then placed supine on the table and prepped and draped in  the usual sterile fashion.  Ultrasound was used to evaluate the right common femoral vein.  It was patent, as it was echolucent and compressible.  A digital ultrasound image was acquired for the permanent record.  A Seldinger needle was used to access the right common femoral vein under direct ultrasound guidance.  A 0.035 J wire was advanced without resistance and a 5Fr sheath was placed and then upsized to an 8 [place] sheath.   ? ?The wire and pigtail catheter were then negotiated into the right atrium and bolus injection of contrast was utilized to demonstrate the right ventricle and the pulmonary artery outflow. The wire and catheter were then negotiated into the main pulmonary artery where hand injection of contrast was utilized to demonstrate the pulmonary arteries and confirm the locations of the pulmonary emboli.  The JR4 catheter and the advantage wire were used first to cannulate the right lower lobe, then the right middle and upper lobe pulmonary arteries were cannulated and selective imaging showed extensive thrombosis in all 3 lobar pulmonary arteries as well as back into the right main pulmonary artery.  The catheter was then directed to the left side without the advantage wire in the left lower lobe and left upper lobe pulmonary arteries also showed extensive thrombus burden going back into the left main pulmonary artery. ? ? ?TPA was reconstituted and delivered onto the table. A total of 8 milligrams of TPA was utilized.  4 mg was administered on the left side and 4 mg was administered on the right side. This was then allowed to dwell. ? ?The Penumbra Cat 12 catheter was then advanced up into the pulmonary vasculature. The right  lung was addressed first. Catheter was negotiated into the right lower lobe and mechanical thrombectomy was performed with use of a separate. Follow-up imaging demonstrated a good result and therefore the catheter was renegotiated into the right middle lobe pulmonary artery  and again mechanical thrombectomy was performed. Passes were made with both the Penumbra catheter itself as well as introducing the separator.  I then was able to navigate the CAT 12 catheter up into the right upper lobe pulmonary artery and for mechanical thrombectomy with use the separator. follow-up imaging was then performed.  Significant reduction in thrombus burden on the right side was then seen. ? ?The Penumbra Cat 12 catheter was then negotiated to the opposite side. The left lung was then addressed. Catheter was negotiated into the left upper lobe pulmonary artery and mechanical thrombectomy was performed with the help of the separate. Follow-up imaging demonstrated a good result and therefore the catheter was renegotiated into the left lower lobe pulmonary artery and again mechanical thrombectomy was performed. Passes were made with both the Penumbra catheter itself as well as introducing the separator. Follow-up imaging was then performed. ? ?After review these images wires were reintroduced and the catheters removed. Then, the sheath is then pulled and pressures held. A safeguard is placed. ? ? ? ?Findings:  ? Right heart imaging:  Right atrium and right ventricle and the pulmonary outflow tract appears mildly dilated ? Right lung: Extensive thrombus burden in the right main pulmonary artery, the right lower lobe, middle lobe, and upper lobe pulmonary artery ? Left lung: Extensive thrombus burden in the left main pulmonary artery, left upper lobe, and left lower lobe pulmonary artery ? ? ? ?Disposition: Patient was taken to the recovery room in stable condition having tolerated the procedure well. ? ?Leotis Pain ?09/21/2021,5:30 PM  ?

## 2021-09-21 NOTE — Assessment & Plan Note (Addendum)
Multiple family members had thrombotic embolic events.  We will refer patient to hematology, continue anticoagulation for now. ?

## 2021-09-21 NOTE — ED Provider Notes (Signed)
? ?[facility] ?Provider Note ? ? ? Event Date/Time  ? First MD Initiated Contact with Patient 09/21/21 1227   ?  (approximate) ? ?History  ? ?Chief Complaint: Near Syncope and Shortness of Breath ? ?HPI ? ?Roberto Pope is a 61 y.o. male with a past medical history of depression who presents to the emergency department for back pain and shortness of breath.  According to the patient since Sunday he has been experiencing pain in his back worse when he takes a deep breath and a sensation like he cannot get a full deep breath of air.  He states today he was sitting and when he stood up he had a sharp pain in his back felt short of breath and felt lightheaded like he was going to pass out.  Patient was able to get himself seated once again and did not actually lose consciousness.  Did not hit his head.  Here the patient noted to be tachycardic around 130.  Borderline hypoxic in the upper 80s/low 90s placed on 2 L nasal cannula oxygen with no baseline O2 requirement.  Patient denies any recent cough or fever.  Denies any leg pain or swelling.  No history of cardiac disease or pulmonary emboli/DVT.  No blood thinners. ? ?Physical Exam  ? ?Triage Vital Signs: ?ED Triage Vitals  ?Enc Vitals Group  ?   BP 09/21/21 1226 (!) 152/86  ?   Pulse Rate 09/21/21 1226 (!) 128  ?   Resp 09/21/21 1226 (!) 22  ?   Temp --   ?   Temp src --   ?   SpO2 09/21/21 1226 98 %  ?   Weight 09/21/21 1228 224 lb (101.6 kg)  ?   Height 09/21/21 1228 '6\' 2"'$  (1.88 m)  ?   Head Circumference --   ?   Peak Flow --   ?   Pain Score 09/21/21 1227 4  ?   Pain Loc --   ?   Pain Edu? --   ?   Excl. in [place]? --   ? ? ?Most recent vital signs: ?Vitals:  ? 09/21/21 1226  ?BP: (!) 152/86  ?Pulse: (!) 128  ?Resp: (!) 22  ?SpO2: 98%  ? ? ?General: Awake, no distress.  ?CV:  Good peripheral perfusion.  Regular rhythm rate around 130. ?Resp:  Mild tachypnea but overall normal effort.  No wheeze rales or rhonchi ?Abd:  No distention.  Soft,  nontender.  No rebound or guarding. ?Other:  No lower extremity edema or tenderness ? ? ?ED Results / Procedures / Treatments  ? ?EKG ? ?EKG viewed and interpreted by myself shows sinus tachycardia at 125 bpm with a narrow QRS, normal axis, normal intervals, no concerning ST changes. ? ?RADIOLOGY ? ?I personally reviewed the CTA images patient appears to have a large PE. ? ? ?MEDICATIONS ORDERED IN ED: ?Medications  ?sodium chloride 0.9 % bolus 1,000 mL (1,000 mLs Intravenous New Bag/Given 09/21/21 1254)  ? ? ? ?IMPRESSION / MDM / ASSESSMENT AND PLAN / ED COURSE  ?I reviewed the triage vital signs and the nursing notes. ? ?Patient presents to the emergency department for back pain with acute onset of near syncope today associated with back pain and shortness of breath.  Patient is tachycardic borderline hypoxic with mild tachypnea.  Immediate concern for pulmonary embolism.  Differential would also include ACS, COVID/influenza, pneumonia, pneumothorax, vasovagal/orthostatic/dehydration.  We will check labs, I have ordered a CTA of the chest.  I have called CT scan and asked them to go ahead and scanned the chest prior to lab results.  I have reviewed historical labs and his kidney function is historically normal. ? ?Patient's labs have resulted showing elevated troponin of 317, chemistry shows slight LFT elevation otherwise largely within normal limits.  CBC shows slight leukocytosis otherwise normal.  Given the patient's elevated troponin with clinical signs and symptoms of a pulmonary emboli I have ordered heparin infusion.  CTA is pending.  Patient will require admission once his work-up is been completed. ? ?I personally reviewed the CTA images.  Patient has massive PE including bilateral pulmonary artery. ? ?Radiology just called me regarding the CT confirming massive PE burden including saddle emboli and significant right ventricular dilation.  I will speak with vascular surgery for possible directed  thrombolysis or thrombectomy.  Patient remains in no distress, able to speak in full sentences but remains tachycardic and on oxygen. ? ?Spoke with Dr. Lucky Cowboy of vascular surgery.  They have posted the patient for thrombectomy this afternoon.  Patient admitted to the hospital service. ? ?CRITICAL CARE ?Performed by: Harvest Dark ? ? ?Total critical care time: 45 minutes ? ?Critical care time was exclusive of separately billable procedures and treating other patients. ? ?Critical care was necessary to treat or prevent imminent or life-threatening deterioration. ? ?Critical care was time spent personally by me on the following activities: development of treatment plan with patient and/or surrogate as well as nursing, discussions with consultants, evaluation of patient's response to treatment, examination of patient, obtaining history from patient or surrogate, ordering and performing treatments and interventions, ordering and review of laboratory studies, ordering and review of radiographic studies, pulse oximetry and re-evaluation of patient's condition. ? ? ?FINAL CLINICAL IMPRESSION(S) / ED DIAGNOSES  ? ?Pulmonary emboli ?Near syncope ? ?Note:  This document was prepared using Dragon voice recognition software and may include unintentional dictation errors. ?  ?Harvest Dark, MD ?09/21/21 1427 ? ?

## 2021-09-21 NOTE — Plan of Care (Signed)
  Problem: Elimination: Goal: Will not experience complications related to bowel motility Outcome: Progressing   

## 2021-09-21 NOTE — Consult Note (Signed)
ANTICOAGULATION CONSULT NOTE  ? ?Pharmacy Consult for Heparin  ?Indication: pulmonary embolus ? ?No Known Allergies ? ?Patient Measurements: ?Height: '6\' 2"'$  (188 cm) ?Weight: 101.6 kg (224 lb) ?IBW/kg (Calculated) : 82.2 ?Heparin Dosing Weight: 101.6 kg ? ?Vital Signs: ?BP: 152/86 (04/05 1226) ?Pulse Rate: 128 (04/05 1226) ? ?Labs: ?Recent Labs  ?  09/21/21 ?1228  ?HGB 14.2  ?HCT 42.0  ?PLT 204  ?CREATININE 0.77  ?TROPONINIHS 317*  ? ? ?Estimated Creatinine Clearance: 121.9 mL/min (by C-G formula based on SCr of 0.77 mg/dL). ? ? ?Medical History: ?Past Medical History:  ?Diagnosis Date  ? Depression   ? Restless leg   ? ? ?Medications:  ?(Not in a hospital admission)  ?Scheduled:  ?Infusions:  ?PRN:  ?Anti-infectives (From admission, onward)  ? ? None  ? ?  ? ? ?Assessment: ?Pharmacy consulted to start heparin for PE. No imaging yet. CBC stable. No DOAC PTA. Pt complaint of near syncope and SOB. Baseline labs ordered.  ? ?Goal of Therapy:  ?Heparin level 0.3-0.7 units/ml ?Monitor platelets by anticoagulation protocol: Yes ?  ?Plan:  ?Give 6000 units bolus x 1 ?Start heparin infusion at 1600 units/hr ?Check anti-Xa level in 6 hours and daily while on heparin ?Continue to monitor H&H and platelets ? ?Oswald Hillock, PharmD, BCPS  ?09/21/2021,1:35 PM ? ? ?

## 2021-09-21 NOTE — Assessment & Plan Note (Addendum)
Patient was placed on heparin GTT, had a pulmonary thrombectomy on 4/5. ?Condition much improved today, he also had elevated troponin, seen by cardiology, feel like this is mainly due to PE.  Patient will be followed by cardiology as outpatient.  Patient be also followed by Dr. Wandra Feinstein in the office.  Currently patient is medically stable to be discharged ?

## 2021-09-22 ENCOUNTER — Other Ambulatory Visit (HOSPITAL_COMMUNITY): Payer: Self-pay

## 2021-09-22 ENCOUNTER — Encounter: Payer: Self-pay | Admitting: Vascular Surgery

## 2021-09-22 ENCOUNTER — Telehealth: Payer: Self-pay | Admitting: Emergency Medicine

## 2021-09-22 DIAGNOSIS — I2692 Saddle embolus of pulmonary artery without acute cor pulmonale: Secondary | ICD-10-CM

## 2021-09-22 DIAGNOSIS — Z832 Family history of diseases of the blood and blood-forming organs and certain disorders involving the immune mechanism: Secondary | ICD-10-CM | POA: Diagnosis not present

## 2021-09-22 DIAGNOSIS — R072 Precordial pain: Secondary | ICD-10-CM

## 2021-09-22 DIAGNOSIS — R778 Other specified abnormalities of plasma proteins: Secondary | ICD-10-CM

## 2021-09-22 DIAGNOSIS — R7989 Other specified abnormal findings of blood chemistry: Secondary | ICD-10-CM

## 2021-09-22 DIAGNOSIS — I2602 Saddle embolus of pulmonary artery with acute cor pulmonale: Secondary | ICD-10-CM | POA: Diagnosis not present

## 2021-09-22 LAB — COMPREHENSIVE METABOLIC PANEL
ALT: 36 U/L (ref 0–44)
AST: 32 U/L (ref 15–41)
Albumin: 3.6 g/dL (ref 3.5–5.0)
Alkaline Phosphatase: 60 U/L (ref 38–126)
Anion gap: 8 (ref 5–15)
BUN: 15 mg/dL (ref 8–23)
CO2: 22 mmol/L (ref 22–32)
Calcium: 8.4 mg/dL — ABNORMAL LOW (ref 8.9–10.3)
Chloride: 109 mmol/L (ref 98–111)
Creatinine, Ser: 0.86 mg/dL (ref 0.61–1.24)
GFR, Estimated: >60 mL/min (ref >60)
Glucose, Bld: 122 mg/dL — ABNORMAL HIGH (ref 70–99)
Potassium: 4 mmol/L (ref 3.5–5.1)
Sodium: 139 mmol/L (ref 135–145)
Total Bilirubin: 0.7 mg/dL (ref 0.3–1.2)
Total Protein: 5.9 g/dL — ABNORMAL LOW (ref 6.5–8.1)

## 2021-09-22 LAB — CBC
HCT: 35.7 % — ABNORMAL LOW (ref 39.0–52.0)
Hemoglobin: 12.3 g/dL — ABNORMAL LOW (ref 13.0–17.0)
MCH: 29.6 pg (ref 26.0–34.0)
MCHC: 34.5 g/dL (ref 30.0–36.0)
MCV: 86 fL (ref 80.0–100.0)
Platelets: 177 10*3/uL (ref 150–400)
RBC: 4.15 MIL/uL — ABNORMAL LOW (ref 4.22–5.81)
RDW: 11.9 % (ref 11.5–15.5)
WBC: 7.9 10*3/uL (ref 4.0–10.5)
nRBC: 0 % (ref 0.0–0.2)

## 2021-09-22 LAB — TROPONIN I (HIGH SENSITIVITY)
Troponin I (High Sensitivity): 1114 ng/L (ref <18)
Troponin I (High Sensitivity): 980 ng/L (ref <18)

## 2021-09-22 LAB — HEPARIN LEVEL (UNFRACTIONATED): Heparin Unfractionated: 0.43 IU/mL (ref 0.30–0.70)

## 2021-09-22 MED ORDER — APIXABAN 5 MG PO TABS
5.0000 mg | ORAL_TABLET | Freq: Two times a day (BID) | ORAL | Status: DC
Start: 2021-09-29 — End: 2021-09-22

## 2021-09-22 MED ORDER — APIXABAN 5 MG PO TABS
10.0000 mg | ORAL_TABLET | Freq: Two times a day (BID) | ORAL | 0 refills | Status: DC
Start: 2021-09-22 — End: 2021-10-04

## 2021-09-22 MED ORDER — APIXABAN 5 MG PO TABS
10.0000 mg | ORAL_TABLET | Freq: Two times a day (BID) | ORAL | Status: DC
  Administered 2021-09-22: 10 mg via ORAL
  Filled 2021-09-22: qty 2

## 2021-09-22 MED ORDER — APIXABAN 5 MG PO TABS: 5.0000 mg | ORAL_TABLET | Freq: Two times a day (BID) | ORAL | 0 refills | Status: AC

## 2021-09-22 NOTE — Consult Note (Signed)
? ? ? ?Cardiology Consultation:  ? ?Patient ID: Roberto Pope; [id number]; July 06, 1960  ? ?Admit date: 09/21/2021 ?Date of Consult: 09/22/2021 ? ?Primary Care Provider: Pcp, No ?Primary Cardiologist: New to [facility] - consult by [facility] ?Primary Electrophysiologist:  None ? ? ?Patient Profile:  ? ?Roberto Pope is a 61 y.o. male with a hx of depression and RLS who is being seen today for the evaluation of elevated troponin at the request of Dr. Roosevelt Locks. ? ?History of Present Illness:  ? ?Mr. Hillmer has no previously known cardiac history. He recently traveled to [place] by train, with return trip prolonged. He does report getting up and walking on the train and had "plenty of room." Following this trip, he developed back pain that he initially thought was related to a sneeze. Over the past 3 nights, he was having to sleep sitting up due to back pain. On 4/5, he was laying on the concrete working and stood up. With this, there was near syncope, worsening back pain and an increase in dyspnea. There was some associated diaphoresis and clamminess.   ? ?Upon arrival to [facility], he was noted to have stable BP, heart rate 130 bpm, and hypoxic in the upper 80s to low 90s requiring supplemental oxygen via nasal cannula at 2 L. CTA chest showed an acute submassive saddle embolus with associated right heart strain as well as small infarct in posterolateral left lower lobe and trace pleural effusion. He was evaluated by vascular surgery and underwent thrombolysis in each of the main pulmonary arteries as well as mechanical thrombectomy of the left main, left lower lobe, and left upper lobe pulmonary arteries as well as the right main, right lower lobe, right middle lobe, and right upper lobe pulmonary arteries. High sensitivity troponin of 317 with a delta troponin of 1698. He remains on heparin gtt. Oxygen saturations in the mid 90s on room air. Cardiology is asked to evaluate the elevated troponin.  ? ? ? ?Past Medical History:   ?Diagnosis Date  ? Depression   ? Restless leg   ? ? ? ?Home Meds: ?Prior to Admission medications   ?Medication Sig Start Date End Date Taking? Authorizing Provider  ?PARoxetine (PAXIL) 30 MG tablet Take 30 mg by mouth daily.   Yes [provider]  ?pramipexole (MIRAPEX) 0.5 MG tablet Take 0.5 mg by mouth daily after lunch.   Yes [provider]  ? ? ?Inpatient Medications: ?Scheduled Meds: ? PARoxetine  30 mg Oral Daily  ? pramipexole  0.5 mg Oral Daily  ? ?Continuous Infusions: ? heparin 1,650 Units/hr (09/22/21 0347)  ? ?PRN Meds: ?pramipexole ? ?Allergies:  No Known Allergies ? ?Social History:   ?Social History  ? ?Socioeconomic History  ? Marital status: Married  ?  Spouse name: Not on file  ? Number of children: Not on file  ? Years of education: Not on file  ? Highest education level: Not on file  ?Occupational History  ? Not on file  ?Tobacco Use  ? Smoking status: Unknown  ? Smokeless tobacco: Not on file  ?Substance and Sexual Activity  ? Alcohol use: Not Currently  ? Drug use: Never  ? Sexual activity: Not on file  ?Other Topics Concern  ? Not on file  ?Social History Narrative  ? Not on file  ? ?Social Determinants of Health  ? ?Financial Resource Strain: Not on file  ?Food Insecurity: Not on file  ?Transportation Needs: Not on file  ?Physical Activity: Not on file  ?Stress:  Not on file  ?Social Connections: Not on file  ?Intimate Partner Violence: Not on file  ?  ? ?Family History:   ?Family History  ?Problem Relation Age of Onset  ? Pulmonary embolism Mother   ? Pulmonary embolism Nephew   ? ? ?ROS:  ?Review of Systems  ?Constitutional:  Positive for malaise/fatigue. Negative for chills, diaphoresis, fever and weight loss.  ?HENT:  Negative for congestion.   ?Eyes:  Negative for discharge and redness.  ?Respiratory:  Positive for shortness of breath. Negative for cough, hemoptysis, sputum production and wheezing.   ?Cardiovascular:  Negative for chest pain, palpitations, orthopnea,  claudication, leg swelling and PND.  ?Gastrointestinal:  Negative for abdominal pain, blood in stool, heartburn, melena, nausea and vomiting.  ?Genitourinary:  Negative for hematuria.  ?Musculoskeletal:  Positive for back pain. Negative for falls and myalgias.  ?Skin:  Negative for rash.  ?Neurological:  Negative for dizziness, tingling, tremors, sensory change, speech change, focal weakness, loss of consciousness and weakness.  ?Endo/Heme/Allergies:  Does not bruise/bleed easily.  ?Psychiatric/Behavioral:  Negative for substance abuse. The patient is not nervous/anxious.   ?All other systems reviewed and are negative.   ? ?Physical Exam/Data:  ? ?Vitals:  ? 09/21/21 1830 09/21/21 1939 09/21/21 2326 09/22/21 0421  ?BP: (!) 116/93 (!) 125/93 119/75 (!) 118/93  ?Pulse: 100 96 91 80  ?Resp: '14 18 16 18  '$ ?Temp:  98.3 ?F (36.8 ?C) 97.7 ?F (36.5 ?C) 98 ?F (36.7 ?C)  ?TempSrc:      ?SpO2: 94% 95% 95% 96%  ?Weight:    102.1 kg  ?Height:      ? ? ?Intake/Output Summary (Last 24 hours) at 09/22/2021 0749 ?Last data filed at 09/22/2021 0400 ?Gross per 24 hour  ?Intake 247.6 ml  ?Output 400 ml  ?Net -152.4 ml  ? ?Filed Weights  ? 09/21/21 1228 09/21/21 1633 09/22/21 0421  ?Weight: 101.6 kg 101.6 kg 102.1 kg  ? ?Body mass index is 28.89 kg/m?.  ? ?Physical Exam: ?General: Well developed, well nourished, in no acute distress. ?Head: Normocephalic, atraumatic, sclera non-icteric, no xanthomas, nares without discharge.  ?Neck: Negative for carotid bruits. JVD not elevated. ?Lungs: Clear bilaterally to auscultation without wheezes, rales, or rhonchi. Breathing is unlabored. ?Heart: RRR with S1 S2. No murmurs, rubs, or gallops appreciated. ?Abdomen: Soft, non-tender, non-distended with normoactive bowel sounds. No hepatomegaly. No rebound/guarding. No obvious abdominal masses. ?Msk:  Strength and tone appear normal for age. ?Extremities: No clubbing or cyanosis. No edema. Distal pedal pulses are 2+ and equal bilaterally. ?Neuro: Alert  and oriented X 3. No facial asymmetry. No focal deficit. Moves all extremities spontaneously. ?Psych:  Responds to questions appropriately with a normal affect. ? ? ?EKG:  The EKG was personally reviewed and demonstrates: sinus tachycardia, 125 bpm, inferior Q waves, nonspecific st/t changes ?Telemetry:  Telemetry was personally reviewed and demonstrates: SR, 70s bpm ? ?Weights: ?Filed Weights  ? 09/21/21 1228 09/21/21 1633 09/22/21 0421  ?Weight: 101.6 kg 101.6 kg 102.1 kg  ? ? ?Relevant CV Studies: ? ?Echo pending ? ?Laboratory Data: ? ?Chemistry ?Recent Labs  ?Lab 09/21/21 ?1228 09/22/21 ?1696  ?NA 138 139  ?K 4.3 4.0  ?CL 108 109  ?CO2 22 22  ?GLUCOSE 128* 122*  ?BUN 15 15  ?CREATININE 0.77 0.86  ?CALCIUM 8.7* 8.4*  ?GFRNONAA >60 >60  ?ANIONGAP 8 8  ?  ?Recent Labs  ?Lab 09/21/21 ?1228 09/22/21 ?7893  ?PROT 7.2 5.9*  ?ALBUMIN 4.2 3.6  ?AST 48* 32  ?  ALT 48* 36  ?ALKPHOS 72 60  ?BILITOT 0.9 0.7  ? ?Hematology ?Recent Labs  ?Lab 09/21/21 ?1228 09/22/21 ?1610  ?WBC 11.5* 7.9  ?RBC 4.94 4.15*  ?HGB 14.2 12.3*  ?HCT 42.0 35.7*  ?MCV 85.0 86.0  ?MCH 28.7 29.6  ?MCHC 33.8 34.5  ?RDW 11.7 11.9  ?PLT 204 177  ? ?Cardiac EnzymesNo results for input(s): TROPONINI in the last 168 hours. No results for input(s): TROPIPOC in the last 168 hours.  ?BNPNo results for input(s): BNP, PROBNP in the last 168 hours.  ?DDimer No results for input(s): DDIMER in the last 168 hours. ? ?Radiology/Studies:  ?CT Angio Chest PE W and/or Wo Contrast ? ?Result Date: 09/21/2021 ?IMPRESSION: 1. Positive for acute PE with CT evidence of right heart strain (RV/LV Ratio = 2.6) consistent with at least submassive (intermediate risk) PE. The presence of right heart strain has been associated with an increased risk of morbidity and mortality. Please refer to the "PE Focused" order set in EPIC. Critical Value/emergent results were called by telephone at the time of interpretation on 09/21/2021 at 1:56 pm to provider [facility] , who verbally  acknowledged these results. 2. Small infarct in the posterolateral left lower lobe. 3. Trace left pleural effusion. Electronically Signed   By: Lorin Picket M.D.   On: 09/21/2021 13:59  ? ?PERIPHERAL VASCULAR

## 2021-09-22 NOTE — Assessment & Plan Note (Addendum)
Patient had elevated troponin up to 1698, this is a much higher than expected from PE.  Patient has been seen by cardiology, spoke with Dr. Rockey Situ, he still believe this is a from PE.  But we will schedule outpatient follow-up in the future. ?Patient also scheduled to have echocardiogram performed today, spoke with echocardiogram tech, they are backed up, could not perform until tomorrow.  Discussed with Dr. Rockey Situ, this can be done as outpatient in his office. ? ?

## 2021-09-22 NOTE — Progress Notes (Signed)
?  Transition of Care (TOC) Screening Note ? ? ?Patient Details  ?Name: Roberto Pope ?Date of Birth: 1960/12/04 ? ? ?Transition of Care (TOC) CM/SW Contact:    ?Alberteen Sam, LCSW ?Phone Number: ?09/22/2021, 12:10 PM ? ? ? ?Transition of Care Department [facility]) has reviewed patient and no TOC needs have been identified at this time. We will continue to monitor patient advancement through interdisciplinary progression rounds. If new patient transition needs arise, please place a TOC consult. ? Pricilla Riffle, [place] ?[phone] ? ?

## 2021-09-22 NOTE — Consult Note (Signed)
Educated patient on apixaban and gave 30-day free coupon and 10-dollar co-pay card.  ? ? ?Thanks,  ?Eleonore Chiquito, PharmD.  ?

## 2021-09-22 NOTE — Consult Note (Signed)
ANTICOAGULATION CONSULT NOTE  ? ?Pharmacy Consult for Heparin  ?Indication: pulmonary embolus ? ?No Known Allergies ? ?Patient Measurements: ?Height: '6\' 2"'$  (188 cm) ?Weight: 102.1 kg (225 lb) ?IBW/kg (Calculated) : 82.2 ?Heparin Dosing Weight: 101.6 kg ? ?Vital Signs: ?Temp: 98 ?F (36.7 ?C) (04/06 0421) ?BP: 118/93 (04/06 0421) ?Pulse Rate: 80 (04/06 0421) ? ?Labs: ?Recent Labs  ?  09/21/21 ?1228 09/21/21 ?1343 09/21/21 ?1428 09/21/21 ?2117 09/22/21 ?0347  ?HGB 14.2  --   --   --  12.3*  ?HCT 42.0  --   --   --  35.7*  ?PLT 204  --   --   --  177  ?APTT  --  22*  --  82*  --   ?LABPROT  --  13.7  --   --   --   ?INR  --  1.1  --   --   --   ?HEPARINUNFRC  --   --   --  0.24* 0.43  ?CREATININE 0.77  --   --   --  0.86  ?TROPONINIHS 317*  --  1,698*  --   --   ? ? ? ?Estimated Creatinine Clearance: 113.6 mL/min (by C-G formula based on SCr of 0.86 mg/dL). ? ? ?Medical History: ?Past Medical History:  ?Diagnosis Date  ? Depression   ? Restless leg   ? ? ?Medications:  ?Medications Prior to Admission  ?Medication Sig Dispense Refill Last Dose  ? PARoxetine (PAXIL) 30 MG tablet Take 30 mg by mouth daily.   09/21/2021 at 0800  ? pramipexole (MIRAPEX) 0.5 MG tablet Take 0.5 mg by mouth daily after lunch.   09/20/2021 at 1300  ? ?Scheduled:  ?Infusions:  ?PRN:  ?Anti-infectives (From admission, onward)  ? ? Start     Dose/Rate Route Frequency Ordered Stop  ? 09/21/21 1414  ceFAZolin (ANCEF) IVPB 2g/100 mL premix  Status:  Discontinued       ? 2 g ?200 mL/hr over 30 Minutes Intravenous 30 min pre-op 09/21/21 1414 09/21/21 1749  ? ?  ? ? ?Assessment: ?Pharmacy consulted to start heparin for PE. No imaging yet. CBC stable. No DOAC PTA. Pt complaint of near syncope and SOB. Baseline labs ordered.  ? ? ? ?Goal of Therapy:  ?Heparin level 0.3-0.7 units/ml ?Monitor platelets by anticoagulation protocol: Yes ? ?4/06 0347 HL 0.43, therapeutic x 1 ?  ?Plan:  ?- Continue heparin infusion at 1650 units/hr ?- Recheck HL in 6 hrs to confirm,  then daily  ?- CBC daily per protocol ? ?Renda Rolls, PharmD, MBA ?09/22/2021 ?5:07 AM ? ? ? ?

## 2021-09-22 NOTE — Hospital Course (Signed)
Roberto Pope is a 61 y.o. male with a past medical history of depression who presents to the emergency department for back pain and shortness of breath.  ?Chest CT angiogram showed acute saddle PE.  Patient was treated with IV heparin.  Patient was also seen by Dr. Lucky Cowboy, had a pulmonary thrombectomy on 4/5. ?Condition much improved, transitioning to Eliquis on 4/6. ?

## 2021-09-22 NOTE — Telephone Encounter (Signed)
Incoming secure chat from Dr. Rockey Situ:  ? ?"Roberto Pope could you help me arrange a echo in the office? thx .  Diagnosis code pulmonary embolism, elevated troponin"  ? ?Followed by:  ? ?"Also needs a cardiac CTA for elevated troponin"  ? ?Patient currently being discharged from hospital and inpatient ultrasound unable to complete echo prior to patient leaving.  ? ?Will place order for echo and CTA and follow up with patient tomorrow, 4/7, with instructions and time/date for CTA.  ? ?Message will be sent to scheduler for echo scheduling.  ?

## 2021-09-22 NOTE — Assessment & Plan Note (Signed)
Condition stable  

## 2021-09-22 NOTE — TOC Benefit Eligibility Note (Signed)
Patient Advocate Encounter ? ?Insurance verification completed.   ? ?The patient is currently admitted and upon discharge could be taking Eliquis 5 mg. ? ?The current 30 day co-pay is, $47.00.  ? ?The patient is insured through [employer]  ? ? ? ?Lyndel Safe, CPhT ?Pharmacy Patient Advocate Specialist ?[facility] Patient Advocate Team ?Direct Number: [phone]  Fax: [phone] ? ? ? ? ? ?  ?

## 2021-09-22 NOTE — Progress Notes (Signed)
Echo not done, Patient is being discharge home per Dr zang , dr Rockey Situ office will cll patient to arrange a echo in office.  ?

## 2021-09-22 NOTE — Discharge Summary (Signed)
?Physician Discharge Summary ?  ?Patient: Roberto Pope MRN: [id number] DOB: Oct 16, 1960  ?Admit date:     09/21/2021  ?Discharge date: 09/22/21  ?Discharge Physician: Sharen Hones  ? ?PCP: Pcp, No  ? ?Recommendations at discharge:  ? ?Follow-up with your family doctor in 1 week/ ?Follow-up with Dr. Rockey Situ in 2 weeks. ?Follow-up with Dr. Nada Libman in 1 month ?Follow-up with Dr. Wandra Feinstein in 1 month. ? ?Discharge Diagnoses: ?Principal Problem: ?  Pulmonary embolism ([place]) ?Active Problems: ?  Family history of factor V Leiden mutation ?  Idiopathic peripheral neuropathy ?  Hyperlipidemia ?  Anxiety ?  Pulmonary embolus and infarction [facility]) see CT 09/21/2021, (+)saddle embolus w/ R heart strain and LLL infarction ?  Elevated liver enzymes ?  Depression ?  Restless leg ?  Elevated troponin ? ?Resolved Problems: ?  * No resolved hospital problems. * ? ?Hospital Course: ?Roberto Pope is a 61 y.o. male with a past medical history of depression who presents to the emergency department for back pain and shortness of breath.  ?Chest CT angiogram showed acute saddle PE.  Patient was treated with IV heparin.  Patient was also seen by Dr. Lucky Cowboy, had a pulmonary thrombectomy on 4/5. ?Condition much improved, transitioning to Eliquis on 4/6. ? ?Assessment and Plan: ?Elevated troponin ?Patient had elevated troponin up to 1698, this is a much higher than expected from PE.  Patient has been seen by cardiology, spoke with Dr. Rockey Situ, he still believe this is a from PE.  But we will schedule outpatient follow-up in the future. ?Patient also scheduled to have echocardiogram performed today, spoke with echocardiogram tech, they are backed up, could not perform until tomorrow.  Discussed with Dr. Rockey Situ, this can be done as outpatient in his office. ? ? ?Restless leg ?Condition stable. ? ?Depression ?Follow-up with PCP as outpatient. ? ?Elevated liver enzymes ?Minimal, follow-up outpatient ? ?Pulmonary embolus and infarction [facility]) see CT  09/21/2021, (+)saddle embolus w/ R heart strain and LLL infarction ?Patient was placed on heparin GTT, had a pulmonary thrombectomy on 4/5. ?Condition much improved today, he also had elevated troponin, seen by cardiology, feel like this is mainly due to PE.  Patient will be followed by cardiology as outpatient.  Patient be also followed by Dr. Wandra Feinstein in the office.  Currently patient is medically stable to be discharged ? ?Anxiety ?Continue home medications with Paxil ? ?Hyperlipidemia ?No home meds.  Patient will need to follow-up with PCP in 1 week.  I advised him to go back to his PCP in [place] for now, and the be seen in 1 week.  He can establish PCP in [place] at a later date. ? ?Idiopathic peripheral neuropathy ?Stable, ? ?Family history of factor V Leiden mutation ?Multiple family members had thrombotic embolic events.  We will refer patient to hematology, continue anticoagulation for now. ? ? ? ? ?  ? ? ?Consultants: Vascular surgery, cardiology ?Procedures performed: Pulmonary thrombectomy ?Disposition: Home ?Diet recommendation:  ?Discharge Diet Orders (From admission, onward)  ? ?  Start     Ordered  ? 09/22/21 0000  Diet - low sodium heart healthy       ? 09/22/21 1136  ? ?  ?  ? ?  ? ?Cardiac diet ?DISCHARGE MEDICATION: ?Allergies as of 09/22/2021   ?No Known Allergies ?  ? ?  ?Medication List  ?  ? ?TAKE these medications   ? ?apixaban 5 MG Tabs tablet ?Commonly known as: ELIQUIS ?Take 2 tablets (10 mg total) by  mouth 2 (two) times daily for 7 days. ?  ?apixaban 5 MG Tabs tablet ?Commonly known as: ELIQUIS ?Take 1 tablet (5 mg total) by mouth 2 (two) times daily. ?Start taking on: September 29, 2021 ?  ?PARoxetine 30 MG tablet ?Commonly known as: PAXIL ?Take 30 mg by mouth daily. ?  ?pramipexole 0.5 MG tablet ?Commonly known as: MIRAPEX ?Take 0.5 mg by mouth daily after lunch. ?  ? ?  ? ? Follow-up Information   ? ? Minna Merritts, MD Follow up in 2 week(s).   ?Specialty: Cardiology ?Contact  information: [address] ?[place] [address] ?[phone] ? ? ?  ?  ? ? Dew, Erskine Squibb, MD Follow up in 1 month(s).   ?Specialties: Vascular Surgery, Radiology, Interventional Cardiology ?Contact information: ?[address] ?[place] [address] ?[phone] ? ? ?  ?  ? ? Lloyd Huger, MD Follow up in 1 month(s).   ?Specialty: Oncology ?Contact information: ?[address] ?[place] [address] ?[phone] ? ? ?  ?  ? ?  ?  ? ?  ? ?Discharge Exam: ?Filed Weights  ? 09/21/21 1228 09/21/21 1633 09/22/21 0421  ?Weight: 101.6 kg 101.6 kg 102.1 kg  ? ?General exam: Appears calm and comfortable  ?Respiratory system: Clear to auscultation. Respiratory effort normal. ?Cardiovascular system: S1 & S2 heard, RRR. No JVD, murmurs, rubs, gallops or clicks. No pedal edema. ?Gastrointestinal system: Abdomen is nondistended, soft and nontender. No organomegaly or masses felt. Normal bowel sounds heard. ?Central nervous system: Alert and oriented. No focal neurological deficits. ?Extremities: Symmetric 5 x 5 power. ?Skin: No rashes, lesions or ulcers ?Psychiatry: Judgement and insight appear normal. Mood & affect appropriate.  ? ? ?Condition at discharge: good ? ?The results of significant diagnostics from this hospitalization (including imaging, microbiology, ancillary and laboratory) are listed below for reference.  ? ?Imaging Studies: ?CT Angio Chest PE W and/or Wo Contrast ? ?Result Date: 09/21/2021 ?CLINICAL DATA:  Dizziness and nausea with standing.  Back pain. EXAM: CT ANGIOGRAPHY CHEST WITH CONTRAST TECHNIQUE: Multidetector CT imaging of the chest was performed using the standard protocol during bolus administration of intravenous contrast. Multiplanar CT image reconstructions and MIPs were obtained to evaluate the vascular anatomy. RADIATION DOSE REDUCTION: This exam was performed according to the departmental dose-optimization program which includes automated exposure control, adjustment  of the mA and/or kV according to patient size and/or use of iterative reconstruction technique. CONTRAST:  17m OMNIPAQUE IOHEXOL 350 MG/ML SOLN COMPARISON:  None. FINDINGS: Cardiovascular: Saddle embolus with extensive clot extending to the lobar, segmental and subsegmental levels bilaterally. RV/LV ratio of 2.6. Venous air, likely related to contrast injection. Heart is at the upper limits of normal in size. No pericardial effusion. Mediastinum/Nodes: Mediastinal lymph nodes measure up to 1.4 cm in the low right paratracheal station. No hilar or axillary adenopathy. Esophagus is grossly unremarkable. Lungs/Pleura: Rounded subpleural ground-glass and consolidation in the posterolateral left lower lobe. Minimal subpleural atelectasis in the lingula and left lower lobe. Trace left pleural effusion. Airway is unremarkable. Upper Abdomen: Visualized portions of the liver, adrenal glands, kidneys, spleen, pancreas, stomach and bowel are unremarkable with exception of a small hiatal hernia. Musculoskeletal: Degenerative changes in the spine. No worrisome lytic or sclerotic lesions. Review of the MIP images confirms the above findings. IMPRESSION: 1. Positive for acute PE with CT evidence of right heart strain (RV/LV Ratio = 2.6) consistent with at least submassive (intermediate risk) PE. The presence of right heart strain  has been associated with an increased risk of morbidity and mortality. Please refer to the "PE Focused" order set in EPIC. Critical Value/emergent results were called by telephone at the time of interpretation on 09/21/2021 at 1:56 pm to provider [facility] , who verbally acknowledged these results. 2. Small infarct in the posterolateral left lower lobe. 3. Trace left pleural effusion. Electronically Signed   By: Lorin Picket M.D.   On: 09/21/2021 13:59  ? ?PERIPHERAL VASCULAR CATHETERIZATION ? ?Result Date: 09/21/2021 ?See surgical note for result.  ? ?Microbiology: ?Results for orders placed or  performed during the hospital encounter of 09/21/21  ?Resp Panel by RT-PCR (Flu A&B, Covid) Nasopharyngeal Swab     Status: None  ? Collection Time: 09/21/21 12:28 PM  ? Specimen: Nasopharyngeal Swab; Nasopharyngeal(NP)

## 2021-09-22 NOTE — Assessment & Plan Note (Signed)
Follow-up with PCP as outpatient. ?

## 2021-09-23 ENCOUNTER — Telehealth (HOSPITAL_COMMUNITY): Payer: Self-pay | Admitting: *Deleted

## 2021-09-23 ENCOUNTER — Other Ambulatory Visit (HOSPITAL_COMMUNITY): Payer: Self-pay | Admitting: *Deleted

## 2021-09-23 LAB — PROTEIN C ACTIVITY: Protein C Activity: 91 % (ref 73–180)

## 2021-09-23 LAB — PROTEIN S ACTIVITY: Protein S Activity: 96 % (ref 63–140)

## 2021-09-23 LAB — ANTITHROMBIN III ANTIGEN: AT III AG PPP IMM-ACNC: 97 % (ref 72–124)

## 2021-09-23 MED ORDER — METOPROLOL TARTRATE 100 MG PO TABS: ORAL_TABLET | ORAL | 0 refills | Status: DC

## 2021-09-23 NOTE — Telephone Encounter (Signed)
Spoke with patient.  ? ?Reviewed CTA instructions with patient, have patient scheduled for 4/10 at 1:30pm. Pt verbalized understanding.  ? ?Told patient that he will be called to have echo scheduled as well as f/u appt (needs 2 weeks).  ? ?Will send CTA instructions to pt's mychart so he has them in writing.  ? ?Pt voiced appreciation for the call and understands that he can call our office with questions or concerns.  ?

## 2021-09-23 NOTE — Telephone Encounter (Signed)
Patient returning call.

## 2021-09-23 NOTE — Telephone Encounter (Signed)
Patient needs echo  ?

## 2021-09-23 NOTE — Telephone Encounter (Signed)
Called patient, no answer, lmtcb.  ?

## 2021-09-23 NOTE — Telephone Encounter (Signed)
Attempted to call patient regarding upcoming cardiac CT appointment. °Left message on voicemail with name and callback number ° °Tong Pieczynski RN Navigator Cardiac Imaging °[facility] °[phone] Office °[phone] Cell ° °

## 2021-09-23 NOTE — Telephone Encounter (Signed)
Attempted to schedule echo .  LMOV to call office.  ? ?

## 2021-09-24 LAB — ANTIPHOSPHOLIPID SYNDROME EVAL, BLD
Anticardiolipin IgA: <9 APL U/mL (ref 0–11)
Anticardiolipin IgG: <9 GPL U/mL (ref 0–14)
Anticardiolipin IgM: <9 MPL U/mL (ref 0–12)
DRVVT: 44 s (ref 0.0–47.0)
PTT Lupus Anticoagulant: 52.7 s — ABNORMAL HIGH (ref 0.0–43.5)
Phosphatydalserine, IgA: <1 APS Units (ref 0–19)
Phosphatydalserine, IgG: <9 Units (ref 0–30)
Phosphatydalserine, IgM: 55 Units — ABNORMAL HIGH (ref 0–30)

## 2021-09-24 LAB — PTT-LA MIX: PTT-LA Mix: 47.3 s — ABNORMAL HIGH (ref 0.0–40.5)

## 2021-09-24 LAB — HEXAGONAL PHASE PHOSPHOLIPID: Hexagonal Phase Phospholipid: 7 s (ref 0–11)

## 2021-09-26 ENCOUNTER — Ambulatory Visit
Admission: RE | Admit: 2021-09-26 | Discharge: 2021-09-26 | Disposition: A | Discharge status: Home / Self Care | Payer: BC Managed Care – PPO | Source: Ambulatory Visit | Attending: Cardiovascular Disease | Admitting: Cardiovascular Disease

## 2021-09-26 ENCOUNTER — Telehealth: Payer: Self-pay | Admitting: Cardiology

## 2021-09-26 ENCOUNTER — Encounter: Payer: Self-pay | Admitting: Cardiology

## 2021-09-26 DIAGNOSIS — I2510 Atherosclerotic heart disease of native coronary artery without angina pectoris: Secondary | ICD-10-CM

## 2021-09-26 DIAGNOSIS — R072 Precordial pain: Secondary | ICD-10-CM | POA: Insufficient documentation

## 2021-09-26 LAB — PROTHROMBIN GENE MUTATION

## 2021-09-26 MED ORDER — IOHEXOL 350 MG/ML SOLN
75.0000 mL | Freq: Once | INTRAVENOUS | Status: AC | PRN
  Administered 2021-09-26: 75 mL via INTRAVENOUS

## 2021-09-26 MED ORDER — METOPROLOL TARTRATE 5 MG/5ML IV SOLN
10.0000 mg | Freq: Once | INTRAVENOUS | Status: AC
  Administered 2021-09-26: 10 mg via INTRAVENOUS

## 2021-09-26 MED ORDER — NITROGLYCERIN 0.4 MG SL SUBL
0.8000 mg | SUBLINGUAL_TABLET | Freq: Once | SUBLINGUAL | Status: AC
  Administered 2021-09-26: 0.8 mg via SUBLINGUAL

## 2021-09-26 NOTE — Progress Notes (Signed)
error 

## 2021-09-26 NOTE — Telephone Encounter (Signed)
Received a phone call from patient who is concerned about the findings of his recent coronary CT. Coronary CT showed severe Mid LAD disease. Given patient's recent hospitalization PE, he is very concerned about these new results. Patient reports that he some chest tightness and pain in his left shoulder. Breathing is stable. Reports having similar pain prior to and during his admission. Patient and wife both very concerned about symptoms and coronary CT findings.  ? ?Given that patient was having active chest discomfort/tightness, I recommended that he go to the ER for further evaluation. Patient voiced understanding  ?

## 2021-09-26 NOTE — Progress Notes (Signed)
Patient tolerated procedure well. Ambulate w/o difficulty. Denies any lightheadedness or being dizzy. Pt denies any pain at this time. Sitting in chair, drinking water provided. P is encouraged to drink additional water throughout the day and reason explained to patient. Patient verbalized understanding and all questions answered. ABC intact. No further needs at this time. Discharge from procedure area w/o issues.  °

## 2021-09-27 ENCOUNTER — Telehealth: Payer: Self-pay | Admitting: Pharmacist Clinician (PhC)/ Clinical Pharmacy Specialist

## 2021-09-27 ENCOUNTER — Telehealth: Payer: Self-pay | Admitting: Emergency Medicine

## 2021-09-27 ENCOUNTER — Ambulatory Visit (INDEPENDENT_AMBULATORY_CARE_PROVIDER_SITE_OTHER): Payer: BC Managed Care – PPO | Admitting: Cardiovascular Disease

## 2021-09-27 ENCOUNTER — Encounter: Payer: Self-pay | Admitting: Cardiovascular Disease

## 2021-09-27 VITALS — BP 118/72 | HR 63 | Ht 74.0 in | Wt 227.4 lb

## 2021-09-27 DIAGNOSIS — R072 Precordial pain: Secondary | ICD-10-CM | POA: Diagnosis not present

## 2021-09-27 DIAGNOSIS — I2782 Chronic pulmonary embolism: Secondary | ICD-10-CM

## 2021-09-27 DIAGNOSIS — R0602 Shortness of breath: Secondary | ICD-10-CM | POA: Diagnosis not present

## 2021-09-27 DIAGNOSIS — I2602 Saddle embolus of pulmonary artery with acute cor pulmonale: Secondary | ICD-10-CM | POA: Diagnosis not present

## 2021-09-27 DIAGNOSIS — E782 Mixed hyperlipidemia: Secondary | ICD-10-CM

## 2021-09-27 DIAGNOSIS — I200 Unstable angina: Secondary | ICD-10-CM | POA: Insufficient documentation

## 2021-09-27 DIAGNOSIS — I25118 Atherosclerotic heart disease of native coronary artery with other forms of angina pectoris: Secondary | ICD-10-CM

## 2021-09-27 LAB — FACTOR 5 LEIDEN

## 2021-09-27 MED ORDER — ENOXAPARIN SODIUM 100 MG/ML IJ SOSY: 1.0000 mg/kg | PREFILLED_SYRINGE | Freq: Two times a day (BID) | INTRAMUSCULAR | 0 refills | Status: DC

## 2021-09-27 MED ORDER — ENOXAPARIN SODIUM 100 MG/ML IJ SOSY: 100.0000 mg | PREFILLED_SYRINGE | Freq: Two times a day (BID) | INTRAMUSCULAR | 0 refills | Status: DC

## 2021-09-27 NOTE — Telephone Encounter (Signed)
-----   Message from Minna Merritts, MD sent at 09/26/2021  5:54 PM EDT ----- ?Cardiac CTA ?Still with persistent large PE noted ?Of note is the concern for stenosis in the LAD coronary artery mid region ?A cardiac catheterization has been recommended ?Would recommend quick appointment to discuss results, review imaging, set up a cardiac catheterization if they are in agreement ?

## 2021-09-27 NOTE — Telephone Encounter (Signed)
Called patient. No answer. Lmtcb.  

## 2021-09-27 NOTE — Telephone Encounter (Signed)
Rx for Lovenox already submitted to pharmacy.   LMOM for patient to call and be sure he knows how to use. ?

## 2021-09-27 NOTE — Progress Notes (Signed)
Cardiology Office Note ? ?Date:  09/27/2021  ? ?ID:  Roberto Pope, DOB 06/14/1961, MRN [id number] ? ?PCP:  Pcp, No  ? ?Chief Complaint  ?Patient presents with  ? [facility] follow up   ?  Discuss cardiac cath. Patient c/o shortness of breath with any amount of walking and chest tightness with left shoulder pain. Medications reviewed by the patient verbally.   ? ? ?HPI:  ?Roberto Pope is a 61 year old gentleman with  ?Hospitalization Roberto 2023 with acute PE with right heart strain, small infarct in the posterior lateral left lower lobe ?Status post thrombectomy ?Elevated troponin 1600,  ?presents for follow-up after recent hospitalization  to discuss recent events, cardiac CTA findings ? ?Reports having a trip on a train up to [place] end of March 2023, shortly after diagnosed with PE ?Hypercoagulable panel :Leiden factor V ? ?Initial CT scan in the hospital with acute pulmonary embolism ?right heart strain on CT, submassive saddle PE ?Symptomatically felt much better after thrombectomy ?Echocardiogram is still pending, unable to be performed in the hospital ? ?For elevated troponin, cardiac CTA was ordered ?Case discussed with the reading physician, felt to have occlusion in the mid LAD after diagonal branch ? ?Images pulled up and reviewed with patient and wife on office visit today ?Reports he has some shortness of breath with exertion, manageable ?Felt this was from the residual pulmonary embolism noted on CT scan ? ?EKG personally reviewed by myself on todays visit ?Normal sinus rhythm rate 63 bpm T wave abnormality V1 through V4, inferior leads III and aVF,  unable to exclude anterior ischemia ? ? ?PMH:   has a past medical history of Depression and Restless leg. ? ?PSH:   The histories are not reviewed yet. Please review them in the "History" navigator section and refresh this [place]. ? ?Current Outpatient Medications  ?Medication Sig Dispense Refill  ? apixaban (ELIQUIS) 5 MG TABS tablet Take 2 tablets (10  mg total) by mouth 2 (two) times daily for 7 days. 28 tablet 0  ? PARoxetine (PAXIL) 30 MG tablet Take 30 mg by mouth daily.    ? pramipexole (MIRAPEX) 0.5 MG tablet Take 0.5 mg by mouth daily after lunch.    ? [START ON 09/29/2021] apixaban (ELIQUIS) 5 MG TABS tablet Take 1 tablet (5 mg total) by mouth 2 (two) times daily. (Patient not taking: Reported on 09/27/2021) 60 tablet 0  ? ?No current facility-administered medications for this visit.  ? ? ? ?Allergies:   Patient has no known allergies.  ? ?Social History:  The patient  reports that he has never smoked. He does not have any smokeless tobacco history on file. He reports that he does not currently use alcohol. He reports that he does not use drugs.  ? ?Family History:   family history includes Pulmonary embolism in his mother and nephew.  ? ? ?Review of Systems: ?Review of Systems  ?Constitutional: Negative.   ?HENT: Negative.    ?Respiratory:  Positive for shortness of breath.   ?Cardiovascular: Negative.   ?Gastrointestinal: Negative.   ?Musculoskeletal: Negative.   ?Neurological: Negative.   ?Psychiatric/Behavioral: Negative.    ?All other systems reviewed and are negative. ? ? ?PHYSICAL EXAM: ?VS:  BP 118/72 (BP Location: Left Arm, Patient Position: Sitting, Cuff Size: Normal)   Pulse 63   Ht '6\' 2"'$  (1.88 m)   Wt 103.1 kg   SpO2 98%   BMI 29.19 kg/m?  , BMI Body mass index is 29.19 kg/m?. ?GEN: Well nourished, well  developed, in no acute distress ?HEENT: normal ?Neck: no JVD, carotid bruits, or masses ?Cardiac: RRR; no murmurs, rubs, or gallops,no edema  ?Respiratory:  clear to auscultation bilaterally, normal work of breathing ?GI: soft, nontender, nondistended, + BS ?MS: no deformity or atrophy ?Skin: warm and dry, no rash ?Neuro:  Strength and sensation are intact ?Psych: euthymic mood, full affect ? ? ?Recent Labs: ?09/22/2021: ALT 36; BUN 15; Creatinine, Ser 0.86; Hemoglobin 12.3; Platelets 177; Potassium 4.0; Sodium 139  ? ? ?Lipid Panel ?No results  found for: CHOL, HDL, LDLCALC, TRIG ?  ? ?Wt Readings from Last 3 Encounters:  ?09/27/21 103.1 kg  ?09/22/21 102.1 kg  ?  ? ? ? ?ASSESSMENT AND PLAN: ? ?Problem List Items Addressed This Visit   ? ? Hyperlipidemia  ? Pulmonary embolism ([place])  ? ?Other Visit Diagnoses   ? ? Precordial pain    -  Primary  ? Relevant Orders  ? EKG 12-Lead  ? Coronary artery disease of native artery of native heart with stable angina pectoris ([place])      ? Shortness of breath      ? ?  ? ?Coronary artery disease with stable angina ?Elevated troponin up to 1600 in the setting of pulmonary embolism ?Cardiac CTA concerning for mid LAD stenosis ?T wave inversions noted on EKG V1 through V4, ?Symptoms difficult to interpret as he has chronic shortness of breath since his PE and has residual thrombus noted on imaging ?-He and wife very anxious about results would like to treat aggressively with cardiac catheterization and possible percutaneous intervention ?I have reviewed the risks, indications, and alternatives to cardiac catheterization, possible angioplasty, and stenting with the patient. Risks include but are not limited to bleeding, infection, vascular injury, stroke, myocardial infection, arrhythmia, kidney injury, radiation-related injury in the case of prolonged fluoroscopy use, emergency cardiac surgery, and death. The patient understands the risks of serious complication is 1-2 in 2536 with diagnostic cardiac cath and 1-2% or less with angioplasty/stenting.  ?-He is in agreement, would like to be scheduled as soon as possible ?-He will need a Lovenox bridge off his Eliquis ?We will call in Lovenox 100 twice daily bridge for Wednesday, Thursday with catheterization on Friday, Roberto 14 ? ?Hyperlipidemia ?Recommend he start Crestor 20 mg daily, goal LDL less than 70 ? 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 ? Total encounter time more than 40 minutes ? Greater than 50% was spent in counseling and coordination of care with the patient ? ? ? ?Signed, ?Esmond Plants, M.D., Ph.D. ?[facility] [place], [place] ?[phone] ?

## 2021-09-27 NOTE — Patient Instructions (Addendum)
Medication Instructions:  ? ?We will call you regarding your lovenox bridging prior to your cath.  ? ?If you need a refill on your cardiac medications before your next appointment, please call your pharmacy.  ? ?Lab work: ?No new labs needed ? ?Testing/Procedures: ? ?You are scheduled for a Cardiac Catheterization on Friday, April 14 with Dr. Harrell Gave End. ? ?1. Please arrive at the [facility] at [address], [place], [place] [address] at 7:30 AM (This time is two hours before your procedure to ensure your preparation). Free valet parking service is available.  ? ?Special note: Every effort is made to have your procedure done on time. Please understand that emergencies sometimes delay scheduled procedures. ? ?2. Diet: Do not eat solid foods after midnight.  You may have clear liquids until 5 AM upon the day of the procedure. ? ?3. Labs: Previously drawn during hospital admission on April 6th, 2023. ? ?4. Medication instructions in preparation for your procedure: ? ? Contrast Allergy: No ? ? ?Stop taking Eliquis (Apixiban) on Wednesday, April 12. ? ? ?On the morning of your procedure, take Aspirin and any morning medicines NOT listed above.  You may use sips of water. ? ?5. Plan to go home the same day, you will only stay overnight if medically necessary. ?6. You MUST have a responsible adult to drive you home. ?7. An adult MUST be with you the first 24 hours after you arrive home. ?8. Bring a current list of your medications, and the last time and date medication taken. ?9. Bring ID and current insurance cards. ?10.Please wear clothes that are easy to get on and off and wear slip-on shoes. ? ?Thank you for allowing [place] to care for you! ?  -- [facility]  ? ?Follow-Up: ?At [facility], you and your health needs are our priority.  As part of our continuing mission to provide you with exceptional heart care, we have created designated Provider Care Teams.  These Care Teams  include your primary Cardiologist (physician) and Advanced Practice Providers (APPs -  Physician Assistants and Nurse Practitioners) who all work together to provide you with the care you need, when you need it. ? ?You will need a follow up appointment in 1 month ? ?Providers on your designated Care Team:   ?Murray Hodgkins, NP ?Christell Faith, PA-C ?Cadence Kathlen Mody, PA-C ? ?COVID-19 Vaccine Information can be found at: [email or web address].uk For questions related to vaccine distribution or appointments, please email [email or web address] or call [phone].  ?

## 2021-09-27 NOTE — Addendum Note (Signed)
Addended by: Minna Merritts on: 09/27/2021 04:46 PM ? ? Modules accepted: Orders ? ?

## 2021-09-27 NOTE — H&P (View-Only) (Signed)
Cardiology Office Note ? ?Date:  09/27/2021  ? ?ID:  Roberto Pope, DOB 1960/08/29, MRN [id number] ? ?PCP:  Pcp, No  ? ?Chief Complaint  ?Patient presents with  ? [facility] follow up   ?  Discuss cardiac cath. Patient c/o shortness of breath with any amount of walking and chest tightness with left shoulder pain. Medications reviewed by the patient verbally.   ? ? ?HPI:  ?Roberto Pope is a 61 year old gentleman with  ?Hospitalization Roberto 2023 with acute PE with right heart strain, small infarct in the posterior lateral left lower lobe ?Status post thrombectomy ?Elevated troponin 1600,  ?presents for follow-up after recent hospitalization  to discuss recent events, cardiac CTA findings ? ?Reports having a trip on a train up to [place] end of March 2023, shortly after diagnosed with PE ?Hypercoagulable panel :Leiden factor V ? ?Initial CT scan in the hospital with acute pulmonary embolism ?right heart strain on CT, submassive saddle PE ?Symptomatically felt much better after thrombectomy ?Echocardiogram is still pending, unable to be performed in the hospital ? ?For elevated troponin, cardiac CTA was ordered ?Case discussed with the reading physician, felt to have occlusion in the mid LAD after diagonal branch ? ?Images pulled up and reviewed with patient and wife on office visit today ?Reports he has some shortness of breath with exertion, manageable ?Felt this was from the residual pulmonary embolism noted on CT scan ? ?EKG personally reviewed by myself on todays visit ?Normal sinus rhythm rate 63 bpm T wave abnormality V1 through V4, inferior leads III and aVF,  unable to exclude anterior ischemia ? ? ?PMH:   has a past medical history of Depression and Restless leg. ? ?PSH:   The histories are not reviewed yet. Please review them in the "History" navigator section and refresh this [place]. ? ?Current Outpatient Medications  ?Medication Sig Dispense Refill  ? apixaban (ELIQUIS) 5 MG TABS tablet Take 2 tablets (10  mg total) by mouth 2 (two) times daily for 7 days. 28 tablet 0  ? PARoxetine (PAXIL) 30 MG tablet Take 30 mg by mouth daily.    ? pramipexole (MIRAPEX) 0.5 MG tablet Take 0.5 mg by mouth daily after lunch.    ? [START ON 09/29/2021] apixaban (ELIQUIS) 5 MG TABS tablet Take 1 tablet (5 mg total) by mouth 2 (two) times daily. (Patient not taking: Reported on 09/27/2021) 60 tablet 0  ? ?No current facility-administered medications for this visit.  ? ? ? ?Allergies:   Patient has no known allergies.  ? ?Social History:  The patient  reports that he has never smoked. He does not have any smokeless tobacco history on file. He reports that he does not currently use alcohol. He reports that he does not use drugs.  ? ?Family History:   family history includes Pulmonary embolism in his mother and nephew.  ? ? ?Review of Systems: ?Review of Systems  ?Constitutional: Negative.   ?HENT: Negative.    ?Respiratory:  Positive for shortness of breath.   ?Cardiovascular: Negative.   ?Gastrointestinal: Negative.   ?Musculoskeletal: Negative.   ?Neurological: Negative.   ?Psychiatric/Behavioral: Negative.    ?All other systems reviewed and are negative. ? ? ?PHYSICAL EXAM: ?VS:  BP 118/72 (BP Location: Left Arm, Patient Position: Sitting, Cuff Size: Normal)   Pulse 63   Ht '6\' 2"'$  (1.88 m)   Wt 103.1 kg   SpO2 98%   BMI 29.19 kg/m?  , BMI Body mass index is 29.19 kg/m?. ?GEN: Well nourished, well  developed, in no acute distress ?HEENT: normal ?Neck: no JVD, carotid bruits, or masses ?Cardiac: RRR; no murmurs, rubs, or gallops,no edema  ?Respiratory:  clear to auscultation bilaterally, normal work of breathing ?GI: soft, nontender, nondistended, + BS ?MS: no deformity or atrophy ?Skin: warm and dry, no rash ?Neuro:  Strength and sensation are intact ?Psych: euthymic mood, full affect ? ? ?Recent Labs: ?09/22/2021: ALT 36; BUN 15; Creatinine, Ser 0.86; Hemoglobin 12.3; Platelets 177; Potassium 4.0; Sodium 139  ? ? ?Lipid Panel ?No results  found for: CHOL, HDL, LDLCALC, TRIG ?  ? ?Wt Readings from Last 3 Encounters:  ?09/27/21 103.1 kg  ?09/22/21 102.1 kg  ?  ? ? ? ?ASSESSMENT AND PLAN: ? ?Problem List Items Addressed This Visit   ? ? Hyperlipidemia  ? Pulmonary embolism ([place])  ? ?Other Visit Diagnoses   ? ? Precordial pain    -  Primary  ? Relevant Orders  ? EKG 12-Lead  ? Coronary artery disease of native artery of native heart with stable angina pectoris ([place])      ? Shortness of breath      ? ?  ? ?Coronary artery disease with stable angina ?Elevated troponin up to 1600 in the setting of pulmonary embolism ?Cardiac CTA concerning for mid LAD stenosis ?T wave inversions noted on EKG V1 through V4, ?Symptoms difficult to interpret as he has chronic shortness of breath since his PE and has residual thrombus noted on imaging ?-He and wife very anxious about results would like to treat aggressively with cardiac catheterization and possible percutaneous intervention ?I have reviewed the risks, indications, and alternatives to cardiac catheterization, possible angioplasty, and stenting with the patient. Risks include but are not limited to bleeding, infection, vascular injury, stroke, myocardial infection, arrhythmia, kidney injury, radiation-related injury in the case of prolonged fluoroscopy use, emergency cardiac surgery, and death. The patient understands the risks of serious complication is 1-2 in 9485 with diagnostic cardiac cath and 1-2% or less with angioplasty/stenting.  ?-He is in agreement, would like to be scheduled as soon as possible ?-He will need a Lovenox bridge off his Eliquis ?We will call in Lovenox 100 twice daily bridge for Wednesday, Thursday with catheterization on Friday, Roberto 14 ? ?Hyperlipidemia ?Recommend he start Crestor 20 mg daily, goal LDL less than 70 ? 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 ? Total encounter time more than 40 minutes ? Greater than 50% was spent in counseling and coordination of care with the patient ? ? ? ?Signed, ?Esmond Plants, M.D., Ph.D. ?[facility] [place], [place] ?[phone] ?

## 2021-09-27 NOTE — Addendum Note (Signed)
Addended by: Minna Merritts on: 09/27/2021 04:39 PM ? ? Modules accepted: Orders ? ?

## 2021-09-28 ENCOUNTER — Encounter (INDEPENDENT_AMBULATORY_CARE_PROVIDER_SITE_OTHER): Payer: Self-pay

## 2021-09-28 ENCOUNTER — Other Ambulatory Visit: Payer: Self-pay | Admitting: Cardiovascular Disease

## 2021-09-28 MED ORDER — SODIUM CHLORIDE 0.9% FLUSH: 3.0000 mL | Freq: Two times a day (BID) | INTRAVENOUS | Status: DC

## 2021-09-28 NOTE — Telephone Encounter (Signed)
Spoke with patient - he is aware of schedule for enoxaparin and rx was sent to pharmacy.  All questions answered. ?

## 2021-09-30 DIAGNOSIS — I208 Other forms of angina pectoris: Secondary | ICD-10-CM

## 2021-10-02 ENCOUNTER — Other Ambulatory Visit: Payer: Self-pay | Admitting: Cardiovascular Disease

## 2021-10-03 NOTE — Progress Notes (Signed)
?[place]  ?[phone] [phone] ? ?ID: Roberto Pope OB: Jan 17, 1961  MR#: [id number]  ZJQ#:[id number] ? ?Patient Care Team: ?Juluis Pitch, MD as PCP - General (Family Medicine) ? ?CHIEF COMPLAINT: Saddle pulmonary embolus with right heart strain and left lower lobe lung infarction.  Heterozygous factor V Leiden. ? ?INTERVAL HISTORY: Patient is a 61 year old male who recently presented to the emergency room with increasing shortness of breath and chest pain.  Subsequent imaging revealed a large saddle pulmonary embolism with right heart strain and left lower lobe lung infarction.  He underwent thrombectomy and was subsequently placed on Eliquis.  He currently feels well and is nearly back to his baseline.  He has no neurologic complaints.  He denies any recent fevers.  He has a good appetite and denies weight loss.  He has no further chest pain or shortness of breath.  He denies any nausea, vomiting, constipation, or diarrhea.  He has no urinary complaints.  Patient offers no further specific complaints today. ? ?REVIEW OF SYSTEMS:   ?Review of Systems  ?Constitutional: Negative.  Negative for fever, malaise/fatigue and weight loss.  ?Respiratory: Negative.  Negative for cough, hemoptysis and shortness of breath.   ?Cardiovascular: Negative.  Negative for chest pain and leg swelling.  ?Gastrointestinal: Negative.  Negative for abdominal pain.  ?Genitourinary: Negative.  Negative for dysuria.  ?Musculoskeletal: Negative.  Negative for back pain.  ?Skin: Negative.  Negative for rash.  ?Neurological: Negative.  Negative for dizziness, focal weakness, weakness and headaches.  ?Endo/Heme/Allergies:  Does not bruise/bleed easily.  ?Psychiatric/Behavioral: Negative.  The patient is not nervous/anxious.   ? ?As per HPI. Otherwise, a complete review of systems is negative. ? ?PAST MEDICAL HISTORY: ?Past Medical History:  ?Diagnosis Date  ? Depression   ? Restless leg    ? ? ?PAST SURGICAL HISTORY: ?Past Surgical History:  ?Procedure Laterality Date  ? LEFT HEART CATH AND CORONARY ANGIOGRAPHY Left 10/04/2021  ? Procedure: LEFT HEART CATH AND CORONARY ANGIOGRAPHY;  Surgeon: Nelva Bush, MD;  Location: [facility];  Service: Cardiovascular;  Laterality: Left;  ? PULMONARY THROMBECTOMY Bilateral 09/21/2021  ? Procedure: PULMONARY THROMBECTOMY;  Surgeon: Algernon Huxley, MD;  Location: [facility];  Service: Cardiovascular;  Laterality: Bilateral;  ? ? ?FAMILY HISTORY: ?Family History  ?Problem Relation Age of Onset  ? Pulmonary embolism Mother   ? Pulmonary embolism Nephew   ? ? ?ADVANCED DIRECTIVES (Y/N):  N ? ?HEALTH MAINTENANCE: ?Social History  ? ?Tobacco Use  ? Smoking status: Never  ?Vaping Use  ? Vaping Use: Never used  ?Substance Use Topics  ? Alcohol use: Not Currently  ? Drug use: Never  ? ? ? Colonoscopy: ? PAP: ? Bone density: ? Lipid panel: ? ?No Known Allergies ? ?Current Outpatient Medications  ?Medication Sig Dispense Refill  ? apixaban (ELIQUIS) 5 MG TABS tablet Take 1 tablet (5 mg total) by mouth 2 (two) times daily. 60 tablet 0  ? PARoxetine (PAXIL) 30 MG tablet Take 30 mg by mouth daily.    ? pramipexole (MIRAPEX) 0.5 MG tablet Take 0.5 mg by mouth daily after lunch.    ? rosuvastatin (CRESTOR) 20 MG tablet Take 1 tablet (20 mg total) by mouth daily. 30 tablet 11  ? ?No current facility-administered medications for this visit.  ? ?Facility-Administered Medications Ordered in Other Visits  ?Medication Dose Route Frequency Provider Last Rate Last Admin  ? sodium chloride flush (NS) 0.9 % injection 3 mL  3 mL  Intravenous Q12H Minna Merritts, MD      ? ? ?OBJECTIVE: ?Vitals:  ? 10/06/21 1123  ?BP: 139/83  ?Pulse: (!) 57  ?Resp: 14  ?SpO2: 100%  ?   Body mass index is 30.21 kg/m?Marland Kitchen    ECOG FS:0 - Asymptomatic ? ?General: Well-developed, well-nourished, no acute distress. ?Eyes: Pink conjunctiva, anicteric sclera. ?HEENT: Normocephalic, moist mucous  membranes. ?Lungs: No audible wheezing or coughing. ?Heart: Regular rate and rhythm. ?Abdomen: Soft, nontender, no obvious distention. ?Musculoskeletal: No edema, cyanosis, or clubbing. ?Neuro: Alert, answering all questions appropriately. Cranial nerves grossly intact. ?Skin: No rashes or petechiae noted. ?Psych: Normal affect. ?Lymphatics: No cervical, calvicular, axillary or inguinal LAD. ? ? ?LAB RESULTS: ? ?Lab Results  ?Component Value Date  ? NA 139 09/22/2021  ? K 4.0 09/22/2021  ? CL 109 09/22/2021  ? CO2 22 09/22/2021  ? GLUCOSE 122 (H) 09/22/2021  ? BUN 15 09/22/2021  ? CREATININE 0.86 09/22/2021  ? CALCIUM 8.4 (L) 09/22/2021  ? PROT 5.9 (L) 09/22/2021  ? ALBUMIN 3.6 09/22/2021  ? AST 32 09/22/2021  ? ALT 36 09/22/2021  ? ALKPHOS 60 09/22/2021  ? BILITOT 0.7 09/22/2021  ? GFRNONAA >60 09/22/2021  ? ? ?Lab Results  ?Component Value Date  ? WBC 7.9 09/22/2021  ? HGB 12.3 (L) 09/22/2021  ? HCT 35.7 (L) 09/22/2021  ? MCV 86.0 09/22/2021  ? PLT 177 09/22/2021  ? ? ? ?STUDIES: ?CT Angio Chest PE W and/or Wo Contrast ? ?Result Date: 09/21/2021 ?CLINICAL DATA:  Dizziness and nausea with standing.  Back pain. EXAM: CT ANGIOGRAPHY CHEST WITH CONTRAST TECHNIQUE: Multidetector CT imaging of the chest was performed using the standard protocol during bolus administration of intravenous contrast. Multiplanar CT image reconstructions and MIPs were obtained to evaluate the vascular anatomy. RADIATION DOSE REDUCTION: This exam was performed according to the departmental dose-optimization program which includes automated exposure control, adjustment of the mA and/or kV according to patient size and/or use of iterative reconstruction technique. CONTRAST:  78m OMNIPAQUE IOHEXOL 350 MG/ML SOLN COMPARISON:  None. FINDINGS: Cardiovascular: Saddle embolus with extensive clot extending to the lobar, segmental and subsegmental levels bilaterally. RV/LV ratio of 2.6. Venous air, likely related to contrast injection. Heart is at the  upper limits of normal in size. No pericardial effusion. Mediastinum/Nodes: Mediastinal lymph nodes measure up to 1.4 cm in the low right paratracheal station. No hilar or axillary adenopathy. Esophagus is grossly unremarkable. Lungs/Pleura: Rounded subpleural ground-glass and consolidation in the posterolateral left lower lobe. Minimal subpleural atelectasis in the lingula and left lower lobe. Trace left pleural effusion. Airway is unremarkable. Upper Abdomen: Visualized portions of the liver, adrenal glands, kidneys, spleen, pancreas, stomach and bowel are unremarkable with exception of a small hiatal hernia. Musculoskeletal: Degenerative changes in the spine. No worrisome lytic or sclerotic lesions. Review of the MIP images confirms the above findings. IMPRESSION: 1. Positive for acute PE with CT evidence of right heart strain (RV/LV Ratio = 2.6) consistent with at least submassive (intermediate risk) PE. The presence of right heart strain has been associated with an increased risk of morbidity and mortality. Please refer to the "PE Focused" order set in EPIC. Critical Value/emergent results were called by telephone at the time of interpretation on 09/21/2021 at 1:56 pm to [facility], who verbally acknowledged these results. 2. Small infarct in the posterolateral left lower lobe. 3. Trace left pleural effusion. Electronically Signed   By: MLorin PicketM.D.   On: 09/21/2021 13:59  ? ?  CARDIAC CATHETERIZATION ? ?Result Date: 10/04/2021 ?Conclusions: Severe single-vessel coronary artery disease with chronic total occlusion of the mid LAD.  Distal LAD is supplied by robust right-to-left collaterals. Mild-moderate, nonobstructive coronary artery disease involving the proximal LAD, LCx, and RCA. Normal left ventricular filling pressure (LVEDP 10 mmHg). Recommendations: Continue medical therapy.  I suspect recent dyspnea/fatigue and elevated troponin are due to submassive pulmonary embolism and demand  ischemia. Aggressive secondary prevention of coronary artery disease.  Recommend adding statin therapy to target LDL less than 70. Restart apixaban 5 mg twice daily this evening if no evidence of bleeding or vas

## 2021-10-04 ENCOUNTER — Other Ambulatory Visit: Payer: Self-pay

## 2021-10-04 ENCOUNTER — Encounter
Admission: RE | Disposition: A | Discharge status: Home / Self Care | Payer: Self-pay | Source: Home / Self Care | Attending: Internal Medicine

## 2021-10-04 ENCOUNTER — Encounter: Payer: Self-pay | Admitting: Internal Medicine

## 2021-10-04 ENCOUNTER — Ambulatory Visit
Admission: RE | Admit: 2021-10-04 | Discharge: 2021-10-04 | Disposition: A | Discharge status: Home / Self Care | Payer: BC Managed Care – PPO | Attending: Internal Medicine | Admitting: Internal Medicine

## 2021-10-04 DIAGNOSIS — I25118 Atherosclerotic heart disease of native coronary artery with other forms of angina pectoris: Secondary | ICD-10-CM | POA: Diagnosis present

## 2021-10-04 DIAGNOSIS — R0602 Shortness of breath: Secondary | ICD-10-CM | POA: Insufficient documentation

## 2021-10-04 DIAGNOSIS — I208 Other forms of angina pectoris: Secondary | ICD-10-CM

## 2021-10-04 DIAGNOSIS — I2699 Other pulmonary embolism without acute cor pulmonale: Secondary | ICD-10-CM | POA: Diagnosis not present

## 2021-10-04 DIAGNOSIS — E785 Hyperlipidemia, unspecified: Secondary | ICD-10-CM | POA: Insufficient documentation

## 2021-10-04 DIAGNOSIS — I200 Unstable angina: Secondary | ICD-10-CM | POA: Insufficient documentation

## 2021-10-04 HISTORY — PX: LEFT HEART CATH AND CORONARY ANGIOGRAPHY: CATH118249

## 2021-10-04 SURGERY — LEFT HEART CATH AND CORONARY ANGIOGRAPHY
Anesthesia: Moderate Sedation

## 2021-10-04 SURGERY — LEFT HEART CATH AND CORONARY ANGIOGRAPHY
Anesthesia: Moderate Sedation | Laterality: Left

## 2021-10-04 MED ORDER — IOHEXOL 300 MG/ML  SOLN
INTRAMUSCULAR | Status: DC | PRN
  Administered 2021-10-04: 48 mL

## 2021-10-04 MED ORDER — SODIUM CHLORIDE 0.9% FLUSH: 3.0000 mL | Freq: Two times a day (BID) | INTRAVENOUS | Status: DC

## 2021-10-04 MED ORDER — MIDAZOLAM HCL 2 MG/2ML IJ SOLN
INTRAMUSCULAR | Status: DC | PRN
  Administered 2021-10-04: 1 mg via INTRAVENOUS

## 2021-10-04 MED ORDER — HEPARIN (PORCINE) IN NACL 1000-0.9 UT/500ML-% IV SOLN
INTRAVENOUS | Status: AC
  Filled 2021-10-04: qty 1000

## 2021-10-04 MED ORDER — ASPIRIN 81 MG PO CHEW: 81.0000 mg | CHEWABLE_TABLET | ORAL | Status: DC

## 2021-10-04 MED ORDER — FENTANYL CITRATE (PF) 100 MCG/2ML IJ SOLN
INTRAMUSCULAR | Status: DC | PRN
  Administered 2021-10-04: 50 ug via INTRAVENOUS

## 2021-10-04 MED ORDER — LIDOCAINE HCL (PF) 1 % IJ SOLN
INTRAMUSCULAR | Status: DC | PRN
  Administered 2021-10-04: 2 mL

## 2021-10-04 MED ORDER — HEPARIN (PORCINE) IN NACL 2000-0.9 UNIT/L-% IV SOLN
INTRAVENOUS | Status: DC | PRN
  Administered 2021-10-04: 1000 mL

## 2021-10-04 MED ORDER — MIDAZOLAM HCL 2 MG/2ML IJ SOLN
INTRAMUSCULAR | Status: AC
  Filled 2021-10-04: qty 2

## 2021-10-04 MED ORDER — HEPARIN SODIUM (PORCINE) 1000 UNIT/ML IJ SOLN
INTRAMUSCULAR | Status: DC | PRN
  Administered 2021-10-04: 5000 [IU] via INTRAVENOUS

## 2021-10-04 MED ORDER — VERAPAMIL HCL 2.5 MG/ML IV SOLN
INTRAVENOUS | Status: DC | PRN
  Administered 2021-10-04: 2.5 mg via INTRA_ARTERIAL

## 2021-10-04 MED ORDER — LABETALOL HCL 5 MG/ML IV SOLN: 10.0000 mg | INTRAVENOUS | Status: DC | PRN

## 2021-10-04 MED ORDER — VERAPAMIL HCL 2.5 MG/ML IV SOLN
INTRAVENOUS | Status: AC
  Filled 2021-10-04: qty 2

## 2021-10-04 MED ORDER — ACETAMINOPHEN 325 MG PO TABS: 650.0000 mg | ORAL_TABLET | ORAL | Status: DC | PRN

## 2021-10-04 MED ORDER — SODIUM CHLORIDE 0.9 % IV SOLN: INTRAVENOUS | Status: DC

## 2021-10-04 MED ORDER — ROSUVASTATIN CALCIUM 20 MG PO TABS: 20.0000 mg | ORAL_TABLET | Freq: Every day | ORAL | 11 refills | Status: DC

## 2021-10-04 MED ORDER — SODIUM CHLORIDE 0.9 % IV SOLN: 250.0000 mL | INTRAVENOUS | Status: DC | PRN

## 2021-10-04 MED ORDER — SODIUM CHLORIDE 0.9% FLUSH
3.0000 mL | INTRAVENOUS | Status: DC | PRN
Start: 2021-10-04 — End: 2021-10-04

## 2021-10-04 MED ORDER — ONDANSETRON HCL 4 MG/2ML IJ SOLN
4.0000 mg | Freq: Four times a day (QID) | INTRAMUSCULAR | Status: DC | PRN
Start: 2021-10-04 — End: 2021-10-04

## 2021-10-04 MED ORDER — FENTANYL CITRATE (PF) 100 MCG/2ML IJ SOLN
INTRAMUSCULAR | Status: AC
  Filled 2021-10-04: qty 2

## 2021-10-04 MED ORDER — SODIUM CHLORIDE 0.9 % WEIGHT BASED INFUSION
3.0000 mL/kg/h | INTRAVENOUS | Status: AC
  Administered 2021-10-04: 3 mL/kg/h via INTRAVENOUS

## 2021-10-04 MED ORDER — SODIUM CHLORIDE 0.9 % WEIGHT BASED INFUSION: 1.0000 mL/kg/h | INTRAVENOUS | Status: DC

## 2021-10-04 MED ORDER — SODIUM CHLORIDE 0.9% FLUSH: 3.0000 mL | INTRAVENOUS | Status: DC | PRN

## 2021-10-04 MED ORDER — HEPARIN SODIUM (PORCINE) 1000 UNIT/ML IJ SOLN
INTRAMUSCULAR | Status: AC
  Filled 2021-10-04: qty 10

## 2021-10-04 MED ORDER — LIDOCAINE HCL 1 % IJ SOLN
INTRAMUSCULAR | Status: AC
  Filled 2021-10-04: qty 20

## 2021-10-04 MED ORDER — HYDRALAZINE HCL 20 MG/ML IJ SOLN: 10.0000 mg | INTRAMUSCULAR | Status: DC | PRN

## 2021-10-04 SURGICAL SUPPLY — 11 items
CATH 5F 110X4 TIG (CATHETERS) ×1 IMPLANT
CATH INFINITI 5 FR JL3.5 (CATHETERS) ×1 IMPLANT
DEVICE RAD COMP TR BAND LRG (VASCULAR PRODUCTS) ×1 IMPLANT
DRAPE BRACHIAL (DRAPES) ×1 IMPLANT
GLIDESHEATH SLEND A-KIT 6F 22G (SHEATH) ×1 IMPLANT
GUIDEWIRE INQWIRE 1.5J.035X260 (WIRE) IMPLANT
INQWIRE 1.5J .035X260CM (WIRE) ×2
PACK CARDIAC CATH (CUSTOM PROCEDURE TRAY) ×2 IMPLANT
PROTECTION STATION PRESSURIZED (MISCELLANEOUS) ×2
SET ATX SIMPLICITY (MISCELLANEOUS) ×1 IMPLANT
STATION PROTECTION PRESSURIZED (MISCELLANEOUS) IMPLANT

## 2021-10-04 NOTE — Interval H&P Note (Signed)
History and Physical Interval Note: ? ?10/04/2021 ?9:34 AM ? ?Roberto Pope  has presented today for surgery, with the diagnosis of stable angina.  The various methods of treatment have been discussed with the patient and family. After consideration of risks, benefits and other options for treatment, the patient has consented to  Procedure(s): ?LEFT HEART CATH AND CORONARY ANGIOGRAPHY (Left) as a surgical intervention.  The patient's history has been reviewed, patient examined, no change in status, stable for surgery.  I have reviewed the patient's chart and labs.  Questions were answered to the patient's satisfaction.   ? ?Cath Lab Visit (complete for each Cath Lab visit) ? ?Clinical Evaluation Leading to the Procedure:  ? ?ACS: No. ? ?Non-ACS:   ? ?Anginal Classification: CCS III ? ?Anti-ischemic medical therapy: No Therapy ? ?Non-Invasive Test Results: Occlusion/subtotal occlusion of mid LAD by cardiac CTA ? ?Prior CABG: No previous CABG ? ?Roberto Pope ? ? ?

## 2021-10-05 ENCOUNTER — Encounter: Payer: Self-pay | Admitting: Internal Medicine

## 2021-10-06 ENCOUNTER — Inpatient Hospital Stay: Payer: BC Managed Care – PPO | Attending: Oncology | Admitting: Oncology

## 2021-10-06 ENCOUNTER — Inpatient Hospital Stay: Payer: BC Managed Care – PPO

## 2021-10-06 ENCOUNTER — Encounter: Payer: Self-pay | Admitting: Oncology

## 2021-10-06 VITALS — BP 139/83 | HR 57 | Resp 14 | Ht 73.0 in | Wt 229.0 lb

## 2021-10-06 DIAGNOSIS — Z79899 Other long term (current) drug therapy: Secondary | ICD-10-CM | POA: Diagnosis not present

## 2021-10-06 DIAGNOSIS — D6851 Activated protein C resistance: Secondary | ICD-10-CM | POA: Diagnosis present

## 2021-10-06 DIAGNOSIS — Z7901 Long term (current) use of anticoagulants: Secondary | ICD-10-CM | POA: Diagnosis not present

## 2021-10-06 DIAGNOSIS — I2699 Other pulmonary embolism without acute cor pulmonale: Secondary | ICD-10-CM | POA: Insufficient documentation

## 2021-10-06 DIAGNOSIS — I519 Heart disease, unspecified: Secondary | ICD-10-CM | POA: Insufficient documentation

## 2021-10-13 ENCOUNTER — Ambulatory Visit (INDEPENDENT_AMBULATORY_CARE_PROVIDER_SITE_OTHER): Payer: BC Managed Care – PPO

## 2021-10-13 DIAGNOSIS — I2692 Saddle embolus of pulmonary artery without acute cor pulmonale: Secondary | ICD-10-CM

## 2021-10-13 LAB — ECHOCARDIOGRAM COMPLETE
AR max vel: 3.95 cm2
AV Area VTI: 3.36 cm2
AV Area mean vel: 3.79 cm2
AV Mean grad: 3 mmHg
AV Peak grad: 5.8 mmHg
Ao pk vel: 1.2 m/s
Area-P 1/2: 3.27 cm2
S' Lateral: 2.8 cm

## 2021-10-13 MED ORDER — PERFLUTREN LIPID MICROSPHERE
1.0000 mL | INTRAVENOUS | Status: AC | PRN
  Administered 2021-10-13: 2 mL via INTRAVENOUS

## 2021-10-25 ENCOUNTER — Encounter (INDEPENDENT_AMBULATORY_CARE_PROVIDER_SITE_OTHER): Payer: Self-pay | Admitting: Vascular Surgery

## 2021-10-25 ENCOUNTER — Ambulatory Visit (INDEPENDENT_AMBULATORY_CARE_PROVIDER_SITE_OTHER): Payer: BC Managed Care – PPO | Admitting: Vascular Surgery

## 2021-10-25 VITALS — BP 133/77 | HR 83 | Resp 16 | Wt 226.8 lb

## 2021-10-25 DIAGNOSIS — E785 Hyperlipidemia, unspecified: Secondary | ICD-10-CM | POA: Diagnosis not present

## 2021-10-25 DIAGNOSIS — I2699 Other pulmonary embolism without acute cor pulmonale: Secondary | ICD-10-CM | POA: Diagnosis not present

## 2021-10-25 NOTE — Assessment & Plan Note (Signed)
Patient is doing well about a month status post pulmonary thrombectomy for submassive pulmonary embolus.  He should be on at least 1 year full anticoagulation, and with multiple family members with a history of pulmonary embolus, I would consider lifelong anticoagulation.  At 1 year, 1 could consider decreasing the dose of Eliquis down to 2.5 mg twice daily but remaining on full dose anticoagulation seems very reasonable to me.  I will see him back as needed. ?

## 2021-10-25 NOTE — Progress Notes (Signed)
? ? ?MRN : [id number] ? ?Roberto Pope is a 61 y.o. ([birth year]) male who presents with chief complaint of  ?Chief Complaint  ?Patient presents with  ? Follow-up  ?  [facility] follow up  ?. ? ?History of Present Illness: Patient returns today in follow up of his submassive pulmonary embolus.  He is several weeks status post bilateral pulmonary thrombectomy with marked improvement in his symptoms.  He does still have some shortness of breath with activity although it is much better.  His energy level is better but not back to normal.  He is not currently having any pain or issues with his legs.  They are not swollen.  He denies any fevers or chills.  He is tolerating Eliquis without any bleeding issues ? ?Current Outpatient Medications  ?Medication Sig Dispense Refill  ? apixaban (ELIQUIS) 5 MG TABS tablet Take 1 tablet (5 mg total) by mouth 2 (two) times daily. 60 tablet 0  ? PARoxetine (PAXIL) 30 MG tablet Take 30 mg by mouth daily.    ? pramipexole (MIRAPEX) 0.5 MG tablet Take 0.5 mg by mouth daily after lunch.    ? rosuvastatin (CRESTOR) 20 MG tablet Take 1 tablet (20 mg total) by mouth daily. 30 tablet 11  ? ?No current facility-administered medications for this visit.  ? ?Facility-Administered Medications Ordered in Other Visits  ?Medication Dose Route Frequency Provider Last Rate Last Admin  ? sodium chloride flush (NS) 0.9 % injection 3 mL  3 mL Intravenous Q12H Gollan, Kathlene November, MD      ? ? ?Past Medical History:  ?Diagnosis Date  ? Depression   ? Restless leg   ? ? ?Past Surgical History:  ?Procedure Laterality Date  ? LEFT HEART CATH AND CORONARY ANGIOGRAPHY Left 10/04/2021  ? Procedure: LEFT HEART CATH AND CORONARY ANGIOGRAPHY;  Surgeon: Nelva Bush, MD;  Location: [facility];  Service: Cardiovascular;  Laterality: Left;  ? PULMONARY THROMBECTOMY Bilateral 09/21/2021  ? Procedure: PULMONARY THROMBECTOMY;  Surgeon: Algernon Huxley, MD;  Location: [facility];  Service: Cardiovascular;   Laterality: Bilateral;  ? ? ? ?Social History  ? ?Tobacco Use  ? Smoking status: Never  ?Vaping Use  ? Vaping Use: Never used  ?Substance Use Topics  ? Alcohol use: Not Currently  ? Drug use: Never  ? ? ? ? ?Family History  ?Problem Relation Age of Onset  ? Pulmonary embolism Mother   ? Pulmonary embolism Nephew   ?No aneurysms ? ?No Known Allergies ? ? ?REVIEW OF SYSTEMS (Negative unless checked) ? ?Constitutional: '[]'$ Weight loss  '[]'$ Fever  '[]'$ Chills ?Cardiac: '[]'$ Chest pain   '[]'$ Chest pressure   '[]'$ Palpitations   '[]'$ Shortness of breath when laying flat   '[]'$ Shortness of breath at rest   '[x]'$ Shortness of breath with exertion. ?Vascular:  '[]'$ Pain in legs with walking   '[]'$ Pain in legs at rest   '[]'$ Pain in legs when laying flat   '[]'$ Claudication   '[]'$ Pain in feet when walking  '[]'$ Pain in feet at rest  '[]'$ Pain in feet when laying flat   '[x]'$ History of DVT   '[x]'$ Phlebitis   '[]'$ Swelling in legs   '[]'$ Varicose veins   '[]'$ Non-healing ulcers ?Pulmonary:   '[]'$ Uses home oxygen   '[]'$ Productive cough   '[]'$ Hemoptysis   '[]'$ Wheeze  '[]'$ COPD   '[]'$ Asthma ?Neurologic:  '[]'$ Dizziness  '[]'$ Blackouts   '[]'$ Seizures   '[]'$ History of stroke   '[]'$ History of TIA  '[]'$ Aphasia   '[]'$ Temporary blindness   '[]'$ Dysphagia   '[]'$ Weakness or numbness in arms   '[]'$   Weakness or numbness in legs ?Musculoskeletal:  '[]'$ Arthritis   '[]'$ Joint swelling   '[]'$ Joint pain   '[]'$ Low back pain ?Hematologic:  '[]'$ Easy bruising  '[]'$ Easy bleeding   '[]'$ Hypercoagulable state   '[]'$ Anemic   ?Gastrointestinal:  '[]'$ Blood in stool   '[]'$ Vomiting blood  '[]'$ Gastroesophageal reflux/heartburn   '[]'$ Abdominal pain ?Genitourinary:  '[]'$ Chronic kidney disease   '[]'$ Difficult urination  '[]'$ Frequent urination  '[]'$ Burning with urination   '[]'$ Hematuria ?Skin:  '[]'$ Rashes   '[]'$ Ulcers   '[]'$ Wounds ?Psychological:  '[]'$ History of anxiety   '[]'$  History of major depression. ? ?Physical Examination ? ?BP 133/77 (BP Location: Right Arm)   Pulse 83   Resp 16   Wt 226 lb 12.8 oz (102.9 kg)   BMI 29.92 kg/m?  ?Gen:  WD/WN, NAD ?Head: [place]/AT, No temporalis  wasting. ?Ear/Nose/Throat: Hearing grossly intact, nares w/o erythema or drainage ?Eyes: Conjunctiva clear. Sclera non-icteric ?Neck: Supple.  Trachea midline ?Pulmonary:  Good air movement, no use of accessory muscles.  ?Cardiac: RRR, no JVD ?Vascular:  ?Vessel Right Left  ?Radial Palpable Palpable  ?    ?    ? ?Musculoskeletal: M/S 5/5 throughout.  No deformity or atrophy.  No significant lower extremity edema. ?Neurologic: Sensation grossly intact in extremities.  Symmetrical.  Speech is fluent.  ?Psychiatric: Judgment intact, Mood & affect appropriate for pt's clinical situation. ?Dermatologic: No rashes or ulcers noted.  No cellulitis or open wounds. ? ? ? ? ? ?Labs ?Recent Results (from the past 2160 hour(s))  ?CBC     Status: Abnormal  ? Collection Time: 09/21/21 12:28 PM  ?Result Value Ref Range  ? WBC 11.5 (H) 4.0 - 10.5 K/uL  ? RBC 4.94 4.22 - 5.81 MIL/uL  ? Hemoglobin 14.2 13.0 - 17.0 g/dL  ? HCT 42.0 39.0 - 52.0 %  ? MCV 85.0 80.0 - 100.0 fL  ? MCH 28.7 26.0 - 34.0 pg  ? MCHC 33.8 30.0 - 36.0 g/dL  ? RDW 11.7 11.5 - 15.5 %  ? Platelets 204 150 - 400 K/uL  ? nRBC 0.0 0.0 - 0.2 %  ?  Comment: Performed at [facility], [address] [place] [address]  ?Comprehensive metabolic panel     Status: Abnormal  ? Collection Time: 09/21/21 12:28 PM  ?Result Value Ref Range  ? Sodium 138 135 - 145 mmol/L  ? Potassium 4.3 3.5 - 5.1 mmol/L  ? Chloride 108 98 - 111 mmol/L  ? CO2 22 22 - 32 mmol/L  ? Glucose, Bld 128 (H) 70 - 99 mg/dL  ?  Comment: Glucose reference range applies only to samples taken after fasting for at least 8 hours.  ? BUN 15 8 - 23 mg/dL  ? Creatinine, Ser 0.77 0.61 - 1.24 mg/dL  ? Calcium 8.7 (L) 8.9 - 10.3 mg/dL  ? Total Protein 7.2 6.5 - 8.1 g/dL  ? Albumin 4.2 3.5 - 5.0 g/dL  ? AST 48 (H) 15 - 41 U/L  ? ALT 48 (H) 0 - 44 U/L  ? Alkaline Phosphatase 72 38 - 126 U/L  ? Total Bilirubin 0.9 0.3 - 1.2 mg/dL  ? GFR, Estimated >60 >60 mL/min  ?  Comment: (NOTE) ?Calculated using  the CKD-EPI Creatinine Equation (2021) ?  ? Anion gap 8 5 - 15  ?  Comment: Performed at [facility] [address], [place], [place] [address]  ?Troponin I (High Sensitivity)     Status: Abnormal  ? Collection Time: 09/21/21 12:28 PM  ?Result Value Ref Range  ?  Troponin I (High Sensitivity) 317 (HH) <18 ng/L  ?  Comment: CRITICAL RESULT CALLED TO, READ BACK BY AND VERIFIED WITH ?Carroll Kinds RN 1319 09/21/21 HNM ?(NOTE) ?Elevated high sensitivity troponin I (hsTnI) values and significant  ?changes across serial measurements may suggest ACS but many other  ?chronic and acute conditions are known to elevate hsTnI results.  ?Refer to the "Links" section for chest pain algorithms and additional  ?guidance. ?Performed at [facility], [place], ?[place] [address] ?  ?Resp Panel by RT-PCR (Flu A&B, Covid) Nasopharyngeal Swab     Status: None  ? Collection Time: 09/21/21 12:28 PM  ? Specimen: Nasopharyngeal Swab; Nasopharyngeal(NP) swabs in vial transport medium  ?Result Value Ref Range  ? SARS Coronavirus 2 by RT PCR NEGATIVE NEGATIVE  ?  Comment: (NOTE) ?SARS-CoV-2 target nucleic acids are NOT DETECTED. ? ?The SARS-CoV-2 RNA is generally detectable in upper respiratory ?specimens during the acute phase of infection. The lowest ?concentration of SARS-CoV-2 viral copies this assay can detect is ?138 copies/mL. A negative result does not preclude SARS-Cov-2 ?infection and should not be used as the sole basis for treatment or ?other patient management decisions. A negative result may occur with  ?improper specimen collection/handling, submission of specimen other ?than nasopharyngeal swab, presence of viral mutation(s) within the ?areas targeted by this assay, and inadequate number of viral ?copies(<138 copies/mL). A negative result must be combined with ?clinical observations, patient history, and epidemiological ?information. The expected result is Negative. ? ?Fact Sheet for Patients:   ?EntrepreneurPulse.com.au ? ?Fact Sheet for Healthcare Providers:  ?IncredibleEmployment.be ? ?This test is no t yet approved or cleared by the [place] and  ?has been authorize

## 2021-10-25 NOTE — Progress Notes (Signed)
error.  Precepting new NP. ?

## 2021-10-25 NOTE — Assessment & Plan Note (Signed)
lipid control important in reducing the progression of atherosclerotic disease. Continue statin therapy  

## 2021-10-27 ENCOUNTER — Ambulatory Visit (INDEPENDENT_AMBULATORY_CARE_PROVIDER_SITE_OTHER): Payer: BC Managed Care – PPO | Admitting: Physician Assistant

## 2021-10-27 ENCOUNTER — Other Ambulatory Visit
Admission: RE | Admit: 2021-10-27 | Discharge: 2021-10-27 | Disposition: A | Discharge status: Home / Self Care | Payer: BC Managed Care – PPO | Attending: Physician Assistant | Admitting: Physician Assistant

## 2021-10-27 ENCOUNTER — Encounter: Payer: Self-pay | Admitting: Physician Assistant

## 2021-10-27 VITALS — BP 120/76 | HR 50 | Ht 74.0 in | Wt 229.1 lb

## 2021-10-27 DIAGNOSIS — R918 Other nonspecific abnormal finding of lung field: Secondary | ICD-10-CM | POA: Diagnosis not present

## 2021-10-27 DIAGNOSIS — E785 Hyperlipidemia, unspecified: Secondary | ICD-10-CM | POA: Diagnosis not present

## 2021-10-27 DIAGNOSIS — I2602 Saddle embolus of pulmonary artery with acute cor pulmonale: Secondary | ICD-10-CM

## 2021-10-27 DIAGNOSIS — I2510 Atherosclerotic heart disease of native coronary artery without angina pectoris: Secondary | ICD-10-CM

## 2021-10-27 DIAGNOSIS — I25118 Atherosclerotic heart disease of native coronary artery with other forms of angina pectoris: Secondary | ICD-10-CM | POA: Insufficient documentation

## 2021-10-27 DIAGNOSIS — E782 Mixed hyperlipidemia: Secondary | ICD-10-CM

## 2021-10-27 DIAGNOSIS — I2782 Chronic pulmonary embolism: Secondary | ICD-10-CM

## 2021-10-27 LAB — CBC
HCT: 41.4 % (ref 39.0–52.0)
Hemoglobin: 14.2 g/dL (ref 13.0–17.0)
MCH: 28.9 pg (ref 26.0–34.0)
MCHC: 34.3 g/dL (ref 30.0–36.0)
MCV: 84.3 fL (ref 80.0–100.0)
Platelets: 207 10*3/uL (ref 150–400)
RBC: 4.91 MIL/uL (ref 4.22–5.81)
RDW: 11.8 % (ref 11.5–15.5)
WBC: 5.2 10*3/uL (ref 4.0–10.5)
nRBC: 0 % (ref 0.0–0.2)

## 2021-10-27 LAB — COMPREHENSIVE METABOLIC PANEL
ALT: 24 U/L (ref 0–44)
AST: 26 U/L (ref 15–41)
Albumin: 4.3 g/dL (ref 3.5–5.0)
Alkaline Phosphatase: 66 U/L (ref 38–126)
Anion gap: 4 — ABNORMAL LOW (ref 5–15)
BUN: 15 mg/dL (ref 8–23)
CO2: 24 mmol/L (ref 22–32)
Calcium: 9 mg/dL (ref 8.9–10.3)
Chloride: 106 mmol/L (ref 98–111)
Creatinine, Ser: 0.96 mg/dL (ref 0.61–1.24)
GFR, Estimated: >60 mL/min (ref >60)
Glucose, Bld: 114 mg/dL — ABNORMAL HIGH (ref 70–99)
Potassium: 4.4 mmol/L (ref 3.5–5.1)
Sodium: 134 mmol/L — ABNORMAL LOW (ref 135–145)
Total Bilirubin: 0.6 mg/dL (ref 0.3–1.2)
Total Protein: 7 g/dL (ref 6.5–8.1)

## 2021-10-27 LAB — LIPID PANEL
Cholesterol: 134 mg/dL (ref 0–200)
HDL: 31 mg/dL — ABNORMAL LOW (ref >40)
LDL Cholesterol: 89 mg/dL (ref 0–99)
Total CHOL/HDL Ratio: 4.3 RATIO
Triglycerides: 72 mg/dL (ref <150)
VLDL: 14 mg/dL (ref 0–40)

## 2021-10-27 LAB — LDL CHOLESTEROL, DIRECT: Direct LDL: 82.8 mg/dL (ref 0–99)

## 2021-10-27 NOTE — Progress Notes (Signed)
? ?Cardiology Office Note   ? ?Date:  10/27/2021  ? ?ID:  Roberto Pope, DOB 09/24/1960, MRN [id number] ? ?PCP:  Juluis Pitch, MD  ?Cardiologist:  Ida Rogue, MD  ?Electrophysiologist:  None  ? ?Chief Complaint: Follow-up ? ?History of Present Illness:  ? ?Roberto Pope is a 61 y.o. male with history of CAD medically managed as outlined below, recently diagnosed PE in 09/2021 status post pulmonary thrombectomy on 09/21/2021 on apixaban with factor V Leiden, and HLD who presents for follow-up of LHC. ? ?He was admitted to the hospital in 09/2021 with submassive saddle PE and underwent pulmonary thrombectomy on 09/21/2021.  High-sensitivity troponin peaked at 1698.  In hospital follow-up on 09/27/2021 he was feeling better, though did continue to note dyspnea. ? ?Coronary CTA on 09/26/2021 showed persistent large clot burden bilaterally with early subacute pulmonary emboli with decreased clot burden since 09/21/2021.  Ill-defined nodular opacity in the peripheral basilar left lower lobe was not substantially changed and favored evolving pulmonary infarct.  Calcium score of 0 with 99% to 100% noncalcified plaque causing severe mid LAD stenosis.  Given coronary CTA findings, he underwent LHC on 10/04/2021 which showed severe single-vessel CAD with CTO of the mid LAD.  The distal LAD was supplied by robust right to left collaterals.  There was mild to moderate nonobstructive disease otherwise involving the proximal LAD, LCx, and RCA.  Normal LVEDP.  Medical therapy was recommended with suspicion for the patient's fatigue and dyspnea as well as elevated troponin felt to be due to submassive PE and demand ischemia.  Echo on 10/13/2021 showed an EF of 60 to 65%, no regional wall motion abnormalities, indeterminate LV diastolic function parameters, normal RV systolic function and ventricular cavity size, normal PASP estimated at 21.3 mmHg, no significant valvular abnormalities, and an estimated right atrial pressure of 3  mmHg. ? ?He comes in doing well from a cardiac perspective and is without symptoms of angina or decompensation.  He does continue to note some exertional dyspnea and fatigue, though this is improving.  He is remaining active.  He does tinker quite a bit and with this has noted some increased bleeding with excoriations/cuts.  He has had 2 falls since he was last seen, with one fall being out of the back of his truck and another while walking through the yard after mowing the lawn.  He did not suffer LOC or syncope.  He did not hit his head.  No hematochezia or melena.  He has followed up with vascular surgery and hematology with recommendation to continue indefinite anticoagulation.  No cath site complications.  He is working on improving his diet. ? ? ?Labs independently reviewed: ?09/2021 - potassium 4.0, BUN 15, serum creatinine 0.86, BUN 3.6, AST/ALT normal, Hgb 12.3, PLT 177 ?03/2021 - TC 222, TG 192, HDL 31, LDL 153 ? ?Past Medical History:  ?Diagnosis Date  ? CAD (coronary artery disease)   ? Depression   ? Pulmonary embolism ([place])   ? Restless leg   ? ? ?Past Surgical History:  ?Procedure Laterality Date  ? LEFT HEART CATH AND CORONARY ANGIOGRAPHY Left 10/04/2021  ? Procedure: LEFT HEART CATH AND CORONARY ANGIOGRAPHY;  Surgeon: Nelva Bush, MD;  Location: [place] [place] [facility];  Service: Cardiovascular;  Laterality: Left;  ? PULMONARY THROMBECTOMY Bilateral 09/21/2021  ? Procedure: PULMONARY THROMBECTOMY;  Surgeon: Algernon Huxley, MD;  Location: [place] [facility];  Service: Cardiovascular;  Laterality: Bilateral;  ? ? ?Current Medications: ?Current Meds  ?Medication Sig  ?  apixaban (ELIQUIS) 5 MG TABS tablet Take 1 tablet (5 mg total) by mouth 2 (two) times daily.  ? PARoxetine (PAXIL) 30 MG tablet Take 30 mg by mouth daily.  ? pramipexole (MIRAPEX) 0.5 MG tablet Take 0.5 mg by mouth daily after lunch.  ? rosuvastatin (CRESTOR) 20 MG tablet Take 1 tablet (20 mg total) by mouth daily.  ? ? ?Allergies:    Patient has no known allergies.  ? ?Social History  ? ?Socioeconomic History  ? Marital status: Married  ?  Spouse name: Not on file  ? Number of children: Not on file  ? Years of education: Not on file  ? Highest education level: Not on file  ?Occupational History  ? Not on file  ?Tobacco Use  ? Smoking status: Never  ? Smokeless tobacco: Not on file  ?Vaping Use  ? Vaping Use: Never used  ?Substance and Sexual Activity  ? Alcohol use: Not Currently  ? Drug use: Never  ? Sexual activity: Not on file  ?Other Topics Concern  ? Not on file  ?Social History Narrative  ? Not on file  ? ?Social Determinants of Health  ? ?Financial Resource Strain: Not on file  ?Food Insecurity: Not on file  ?Transportation Needs: Not on file  ?Physical Activity: Not on file  ?Stress: Not on file  ?Social Connections: Not on file  ?  ? ?Family History:  ?The patient's family history includes Pulmonary embolism in his mother and nephew. ? ?ROS:   ?12-point review of systems is negative unless otherwise noted in the HPI. ? ? ?EKGs/Labs/Other Studies Reviewed:   ? ?Studies reviewed were summarized above. The additional studies were reviewed today: ? ?Coronary CTA 09/26/2021: ?Coronary over read: ?Aorta:  Normal size.  No calcifications.  No dissection. ?  ?Aortic Valve:  Trileaflet.  No calcifications. ?  ?Coronary Arteries:  Normal coronary origin.  Right dominance. ?  ?RCA is a dominant artery that gives rise to PDA and PLA. There is no ?plaque. ?  ?Left main is a large artery that gives rise to LAD and LCX arteries. ?There is no LM disease ?  ?LAD has along segment of non calcified plaque in the mid segment ?causing severe stenosis (99-100)%. ?  ?LCX is a non-dominant artery that gives rise to two obtuse branches. ?There is no plaque. ?  ?Other findings: ?  ?Normal pulmonary vein drainage into the left atrium. ?  ?Normal left atrial appendage without a thrombus. ?  ?Normal size of the pulmonary artery. Filling defects noted in the ?left  and right pulmonary arteries suggesting PE. ?  ?IMPRESSION: ?1. Coronary calcium score of 0. ?  ?2. Normal coronary origin with right dominance. ?  ?3. Non calcified plaque causing severe mid LAD stenosis (99-100%) ?  ?4. Bilateral filling defects noted in the pulmonary arteries ?suggesting pulmonary embolus (PE). Recommend dedicated CTA chest PE ?protocol for further eval. ?  ?5. CAD-RADS 4 Severe stenosis. (70-99% or > 50% left main). Cardiac ?catheterization is recommended. Consider symptom-guided ?anti-ischemic pharmacotherapy as well as risk factor modification ?per guideline directed care. ? ? ?Noncoronary over read: ?IMPRESSION: ?1. Persistent large clot burden bilateral early subacute pulmonary ?emboli, decreased in clot burden since 09/21/2021 CT. ?2. Ill-defined focal nodular opacity in the peripheral basilar left ?lower lobe, not substantially changed in size from 09/21/2021 chest ?CT although mildly increased in consolidation, favor evolving ?pulmonary infarct. Recommend attention on follow-up chest CT in 3 ?months. ?3. Trace dependent left pleural effusion, stable. ?4.  Small hiatal hernia. ?__________ ? ?LHC 10/04/2021: ?Conclusions: ?Severe single-vessel coronary artery disease with chronic total occlusion of the mid LAD.  Distal LAD is supplied by robust right-to-left collaterals. ?Mild-moderate, nonobstructive coronary artery disease involving the proximal LAD, LCx, and RCA. ?Normal left ventricular filling pressure (LVEDP 10 mmHg). ?  ?Recommendations: ?Continue medical therapy.  I suspect recent dyspnea/fatigue and elevated troponin are due to submassive pulmonary embolism and demand ischemia. ?Aggressive secondary prevention of coronary artery disease.  Recommend adding statin therapy to target LDL less than 70. ?Restart apixaban 5 mg twice daily this evening if no evidence of bleeding or vascular injury at right radial catheterization site. ?__________ ? ?2D echo 10/13/2021: ?1. Left ventricular  ejection fraction, by estimation, is 60 to 65%. The  ?left ventricle has normal function. The left ventricle has no regional  ?wall motion abnormalities. Left ventricular diastolic parameters are  ?indeterminate.

## 2021-10-27 NOTE — Patient Instructions (Signed)
Medication Instructions:  ?No changes at this time.  ? ?*If you need a refill on your cardiac medications before your next appointment, please call your pharmacy* ? ? ?Lab Work: ?CBC, CMET, Lipid, and direct LDL to be done today over at the [facility] entrance. Check in at registration and they will direct you from there.  ? ? ?If you have labs (blood work) drawn today and your tests are completely normal, you will receive your results only by: ?MyChart Message (if you have MyChart) OR ?A paper copy in the mail ?If you have any lab test that is abnormal or we need to change your treatment, we will call you to review the results. ? ? ?Testing/Procedures: ?None ? ? ?Follow-Up: ?At [facility], you and your health needs are our priority.  As part of our continuing mission to provide you with exceptional heart care, we have created designated Provider Care Teams.  These Care Teams include your primary Cardiologist (physician) and Advanced Practice Providers (APPs -  Physician Assistants and Nurse Practitioners) who all work together to provide you with the care you need, when you need it. ? ? ?Your next appointment:   ?3 month(s) ? ?The format for your next appointment:   ?In Person ? ?Provider:   ?Ida Rogue, MD or Christell Faith, PA-C  ? ? ? ? ?Important Information About Sugar ? ? ? ? ?  ?

## 2021-10-28 ENCOUNTER — Telehealth: Payer: Self-pay | Admitting: *Deleted

## 2021-10-28 DIAGNOSIS — I25118 Atherosclerotic heart disease of native coronary artery with other forms of angina pectoris: Secondary | ICD-10-CM

## 2021-10-28 DIAGNOSIS — R911 Solitary pulmonary nodule: Secondary | ICD-10-CM

## 2021-10-28 DIAGNOSIS — E782 Mixed hyperlipidemia: Secondary | ICD-10-CM

## 2021-10-28 DIAGNOSIS — I2510 Atherosclerotic heart disease of native coronary artery without angina pectoris: Secondary | ICD-10-CM

## 2021-10-28 DIAGNOSIS — E785 Hyperlipidemia, unspecified: Secondary | ICD-10-CM

## 2021-10-28 MED ORDER — ROSUVASTATIN CALCIUM 40 MG PO TABS: 40.0000 mg | ORAL_TABLET | Freq: Every day | ORAL | 3 refills | Status: DC

## 2021-10-28 NOTE — Telephone Encounter (Signed)
-----   Message from Rise Mu, PA-C sent at 10/27/2021  4:10 PM EDT ----- ?Blood count is normal ?Random glucose okay ?Potassium at goal ?Renal and liver function normal ?Total cholesterol, triglycerides at goal ?HDL mildly low ?LDL significantly improved, though mildly above goal of less than 70 ? ?Recommendations: ?Continue to increase activity as tolerated, this will help with HDL ?If he is agreeable, increase Crestor to 40 mg daily with a follow-up fasting lipid panel and LFT in 3 months ?

## 2021-10-28 NOTE — Telephone Encounter (Signed)
Reviewed results and recommendations with patient. He was agreeable to increase medication and has appointment with our office in 3 months. He will go have repeat labs in 3 months prior to his appointment that day then come over to see [place]. He verbalized understanding of our conversation with no further questions at this time.  ?

## 2021-10-28 NOTE — Telephone Encounter (Signed)
-----   Message from Rise Mu, PA-C sent at 10/28/2021  4:29 PM EDT ----- ?Pam,  ? ?Please schedule him for a CT chest without contrast after 12/26/2021. This was discussed with him at his OV with me. Dx: pulmonary nodule. His follow up should remain the same in 6 months.  ? ?Thanks, ? ?Ryan ? ?

## 2021-10-28 NOTE — Telephone Encounter (Signed)
Left voicemail message to call back for review of results.  

## 2021-10-28 NOTE — Telephone Encounter (Signed)
Pt returning call. Transferred to RN, Olin Hauser.  ?

## 2021-10-31 NOTE — Telephone Encounter (Signed)
Left voicemail message to call back for review of recommendations.  

## 2021-11-01 NOTE — Telephone Encounter (Signed)
Left voicemail message to call back so we can assist with scheduling CT.  ?

## 2021-11-03 NOTE — Telephone Encounter (Signed)
Patient was returning call, but he ask that you message through [place]. He has it if number not store in his phone it wont ring. But patient stated you can try call too. Please advise

## 2021-11-11 NOTE — Telephone Encounter (Signed)
Left voicemail message to call back or send my chart message confirming CT orders.

## 2021-11-17 ENCOUNTER — Encounter: Payer: Self-pay | Admitting: *Deleted

## 2021-11-17 NOTE — Telephone Encounter (Signed)
Left voicemail message to call back for review of recommendations to have CT chest without contrast after July and to reply to My Chart message with receipt confirmation or give [place] a call back. Will mail out letter with this information and close encounter.

## 2022-01-02 ENCOUNTER — Ambulatory Visit
Admission: RE | Admit: 2022-01-02 | Discharge: 2022-01-02 | Disposition: A | Discharge status: Home / Self Care | Payer: BC Managed Care – PPO | Source: Ambulatory Visit | Attending: Physician Assistant | Admitting: Physician Assistant

## 2022-01-02 ENCOUNTER — Telehealth: Payer: Self-pay | Admitting: *Deleted

## 2022-01-02 DIAGNOSIS — R911 Solitary pulmonary nodule: Secondary | ICD-10-CM | POA: Insufficient documentation

## 2022-01-02 NOTE — Telephone Encounter (Signed)
Left voicemail message to call back for review of results.  

## 2022-01-02 NOTE — Telephone Encounter (Signed)
-----   Message from Roberto Pope, [place] sent at 01/02/2022  4:12 PM EDT ----- Please inform the patient follow-up chest CT showed resolved previously described area of pulmonary infarction with just a small amount of residual scarring noted.  There was also improved aeration of the area in question.  No acute abnormalities.  Overall, reassuring study.

## 2022-01-03 NOTE — Telephone Encounter (Signed)
Follow Up:      Patient is returning Pam's call from yesterday.

## 2022-01-03 NOTE — Telephone Encounter (Signed)
The patient has been notified of the result and verbalized understanding.  All questions (if any) were answered. Kavin Leech, RN 01/03/2022 1:28 PM

## 2022-01-30 NOTE — Progress Notes (Unsigned)
Cardiology Office Note:    Date:  01/31/2022   ID:  Roberto Pope, DOB [birth year], MRN [id number]  PCP:  Roberto Pitch, MD  [facility] Cardiologist:  Ida Rogue, MD  [facility] Electrophysiologist:  None   Referring MD: Roberto Pitch, MD   Chief Complaint: 3 month follow-up  History of Present Illness:    Roberto Pope is a 61 y.o. male with a hx of CAD medically managed as outlined below, recently diagnosed PE in 09/2021 status post pulmonary thrombectomy on 09/21/2021 on apixaban with factor V Leiden, and HLD who presents for follow-up of LHC.   He was admitted to the hospital in 09/2021 with submassive saddle PE and underwent pulmonary thrombectomy on 09/21/2021.  High-sensitivity troponin peaked at 1698.  In hospital follow-up on 09/27/2021 he was feeling better, though did continue to note dyspnea.   Coronary CTA on 09/26/2021 showed persistent large clot burden bilaterally with early subacute pulmonary emboli with decreased clot burden since 09/21/2021.  Ill-defined nodular opacity in the peripheral basilar left lower lobe was not substantially changed and favored evolving pulmonary infarct.  Calcium score of 0 with 99% to 100% noncalcified plaque causing severe mid LAD stenosis.  Given coronary CTA findings, he underwent LHC on 10/04/2021 which showed severe single-vessel CAD with CTO of the mid LAD.  The distal LAD was supplied by robust right to left collaterals.  There was mild to moderate nonobstructive disease otherwise involving the proximal LAD, LCx, and RCA.  Normal LVEDP.  Medical therapy was recommended with suspicion for the patient's fatigue and dyspnea as well as elevated troponin felt to be due to submassive PE and demand ischemia.  Echo on 10/13/2021 showed an EF of 60 to 65%, no regional wall motion abnormalities, indeterminate LV diastolic function parameters, normal RV systolic function and ventricular cavity size, normal PASP estimated at 21.3 mmHg, no  significant valvular abnormalities, and an estimated right atrial pressure of 3 mmHg.  He was last seen 10/2021 and was not having cardiac symptoms. He had 2 falls since that seemed to be mechanical. NO LOC or syncope. No changes were made  Today, the patient reports overall feeling well. He has occasional chest tightness, but it is very brief and self-resolves. It occurs when he over-exerts himself. He is putting up a fence around his yard, which is when he experiences the chest tightness. He has not needed SL NTG. He has been having cramps on the inner thigh and they are hard to get rid of. No swelling or pain. No redness or swelling. It is mainly on the left side. He is taking Eliquis. They feel similar to prior blood clots.    Past Medical History:  Diagnosis Date   CAD (coronary artery disease)    Depression    Pulmonary embolism ([place])    Restless leg     Past Surgical History:  Procedure Laterality Date   LEFT HEART CATH AND CORONARY ANGIOGRAPHY Left 10/04/2021   Procedure: LEFT HEART CATH AND CORONARY ANGIOGRAPHY;  Surgeon: Nelva Bush, MD;  Location: [place] [facility];  Service: Cardiovascular;  Laterality: Left;   PULMONARY THROMBECTOMY Bilateral 09/21/2021   Procedure: PULMONARY THROMBECTOMY;  Surgeon: Algernon Huxley, MD;  Location: [place] [facility];  Service: Cardiovascular;  Laterality: Bilateral;    Current Medications: Current Meds  Medication Sig   apixaban (ELIQUIS) 5 MG TABS tablet Take 1 tablet (5 mg total) by mouth 2 (two) times daily.   PARoxetine (PAXIL) 30 MG tablet Take 10  mg by mouth daily.   pramipexole (MIRAPEX) 0.5 MG tablet Take 0.5 mg by mouth daily after lunch.     Allergies:   Patient has no known allergies.   Social History   Socioeconomic History   Marital status: Married    Spouse name: Not on file   Number of children: Not on file   Years of education: Not on file   Highest education level: Not on file  Occupational History   Not on  file  Tobacco Use   Smoking status: Never   Smokeless tobacco: Not on file  Vaping Use   Vaping Use: Never used  Substance and Sexual Activity   Alcohol use: Not Currently   Drug use: Never   Sexual activity: Not on file  Other Topics Concern   Not on file  Social History Narrative   Not on file   Social Determinants of Health   Financial Resource Strain: Not on file  Food Insecurity: Not on file  Transportation Needs: Not on file  Physical Activity: Not on file  Stress: Not on file  Social Connections: Not on file     Family History: The patient's family history includes Pulmonary embolism in his mother and nephew.  ROS:   Please see the history of present illness.     All other systems reviewed and are negative.  EKGs/Labs/Other Studies Reviewed:    The following studies were reviewed today:  Echo 09/2021 1. Left ventricular ejection fraction, by estimation, is 60 to 65%. The  left ventricle has normal function. The left ventricle has no regional  wall motion abnormalities. Left ventricular diastolic parameters are  indeterminate.   2. Right ventricular systolic function is normal. The right ventricular  size is normal. There is normal pulmonary artery systolic pressure. The  estimated right ventricular systolic pressure is 16.1 mmHg.   3. The mitral valve is normal in structure. No evidence of mitral valve  regurgitation. No evidence of mitral stenosis.   4. The aortic valve is normal in structure. Aortic valve regurgitation is  not visualized. No aortic stenosis is present.   5. The inferior vena cava is normal in size with greater than 50%  respiratory variability, suggesting right atrial pressure of 3 mmHg.   Cardiac cath 09/2021 Conclusions: Severe single-vessel coronary artery disease with chronic total occlusion of the mid LAD.  Distal LAD is supplied by robust right-to-left collaterals. Mild-moderate, nonobstructive coronary artery disease involving the  proximal LAD, LCx, and RCA. Normal left ventricular filling pressure (LVEDP 10 mmHg).   Recommendations: Continue medical therapy.  I suspect recent dyspnea/fatigue and elevated troponin are due to submassive pulmonary embolism and demand ischemia. Aggressive secondary prevention of coronary artery disease.  Recommend adding statin therapy to target LDL less than 70. Restart apixaban 5 mg twice daily this evening if no evidence of bleeding or vascular injury at right radial catheterization site.   Nelva Bush, MD [facility]  EKG:  EKG is ordered today.  The ekg ordered today demonstrates SB, 51bpm, LAD, TWI III  Recent Labs: 10/27/2021: Hemoglobin 14.2; Platelets 207 01/31/2022: ALT 30; BUN 17; Creatinine, Ser 0.93; Potassium 4.4; Sodium 137  Recent Lipid Panel    Component Value Date/Time   CHOL 125 01/31/2022 0850   TRIG 99 01/31/2022 0850   HDL 31 (L) 01/31/2022 0850   CHOLHDL 4.0 01/31/2022 0850   VLDL 20 01/31/2022 0850   LDLCALC 74 01/31/2022 0850   LDLDIRECT 82.8 10/27/2021 0846    Physical Exam:  VS:  BP 110/70 (BP Location: Left Arm, Patient Position: Sitting, Cuff Size: Normal)   Pulse (!) 51   Ht '6\' 2"'$  (1.88 m)   Wt 225 lb 9.6 oz (102.3 kg)   SpO2 99%   BMI 28.97 kg/m     Wt Readings from Last 3 Encounters:  01/31/22 225 lb 9.6 oz (102.3 kg)  10/27/21 229 lb 2 oz (103.9 kg)  10/25/21 226 lb 12.8 oz (102.9 kg)     GEN:  Well nourished, well developed in no acute distress HEENT: Normal NECK: No JVD; No carotid bruits LYMPHATICS: No lymphadenopathy CARDIAC: RRR, no murmurs, rubs, gallops RESPIRATORY:  Clear to auscultation without rales, wheezing or rhonchi  ABDOMEN: Soft, non-tender, non-distended MUSCULOSKELETAL:  No edema; No deformity  SKIN: Warm and dry NEUROLOGIC:  Alert and oriented x 3 PSYCHIATRIC:  Normal affect   ASSESSMENT:    1. Coronary artery disease involving native coronary artery of native heart with angina pectoris ([place])    2. History of DVT of lower extremity   3. Left leg pain   4. Hyperlipidemia, mixed   5. Abnormal CT scan of lung   6. 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 He has been putting up a fence and exerting himself more than normal. Chest tightness is very brief and has not needed SL NTG. LHC 09/2021 showed 1v CAD with CTO of the LAD with collaterals and otherwise mild to moderate nonobstructive CAD , recommended medical management. EK with no new changes. We will defer any further work-up at this time. No ASA with Eliquis. Continue Statin therapy. No BB with baseline bradycardia. If symptoms persist can consider addition of long acting Nitrate, if BP can tolerate.   Leg cramps PE s/p pulmonary thrombectomy 09/2021 Factor V Leiden Patient reports inner thigh cramping at night, similar to prior PE. He reports compliance with Eliquis. I will order a DVT study. Continue Eliquis '5mg'$  BID.   HLD LDL 89. Crestor was increased to '40mg'$  daily. Repeat labs ordered.   Abnormal CT Repeat CT chest showed resolved pulmonary infarction and improved atelectasis.   Disposition: Follow up in 3 month(s) with MD/APP    Signed, Nerida Boivin Ninfa Meeker, PA-C  01/31/2022 10:01 AM    [place]

## 2022-01-31 ENCOUNTER — Other Ambulatory Visit
Admission: RE | Admit: 2022-01-31 | Discharge: 2022-01-31 | Disposition: A | Discharge status: Home / Self Care | Payer: BC Managed Care – PPO | Attending: Medical | Admitting: Medical

## 2022-01-31 ENCOUNTER — Encounter: Payer: Self-pay | Admitting: Medical

## 2022-01-31 ENCOUNTER — Ambulatory Visit (INDEPENDENT_AMBULATORY_CARE_PROVIDER_SITE_OTHER): Payer: BC Managed Care – PPO | Admitting: Medical

## 2022-01-31 VITALS — BP 110/70 | HR 51 | Ht 74.0 in | Wt 225.6 lb

## 2022-01-31 DIAGNOSIS — I2782 Chronic pulmonary embolism: Secondary | ICD-10-CM

## 2022-01-31 DIAGNOSIS — E782 Mixed hyperlipidemia: Secondary | ICD-10-CM

## 2022-01-31 DIAGNOSIS — I2510 Atherosclerotic heart disease of native coronary artery without angina pectoris: Secondary | ICD-10-CM | POA: Diagnosis present

## 2022-01-31 DIAGNOSIS — I25118 Atherosclerotic heart disease of native coronary artery with other forms of angina pectoris: Secondary | ICD-10-CM | POA: Diagnosis present

## 2022-01-31 DIAGNOSIS — M79605 Pain in left leg: Secondary | ICD-10-CM | POA: Diagnosis present

## 2022-01-31 DIAGNOSIS — Z86718 Personal history of other venous thrombosis and embolism: Secondary | ICD-10-CM | POA: Diagnosis not present

## 2022-01-31 DIAGNOSIS — I25119 Atherosclerotic heart disease of native coronary artery with unspecified angina pectoris: Secondary | ICD-10-CM

## 2022-01-31 DIAGNOSIS — E785 Hyperlipidemia, unspecified: Secondary | ICD-10-CM

## 2022-01-31 DIAGNOSIS — I2602 Saddle embolus of pulmonary artery with acute cor pulmonale: Secondary | ICD-10-CM

## 2022-01-31 DIAGNOSIS — R918 Other nonspecific abnormal finding of lung field: Secondary | ICD-10-CM

## 2022-01-31 LAB — HEPATIC FUNCTION PANEL
ALT: 30 U/L (ref 0–44)
AST: 29 U/L (ref 15–41)
Albumin: 4.6 g/dL (ref 3.5–5.0)
Alkaline Phosphatase: 71 U/L (ref 38–126)
Bilirubin, Direct: 0.1 mg/dL (ref 0.0–0.2)
Indirect Bilirubin: 1 mg/dL — ABNORMAL HIGH (ref 0.3–0.9)
Total Bilirubin: 1.1 mg/dL (ref 0.3–1.2)
Total Protein: 7.1 g/dL (ref 6.5–8.1)

## 2022-01-31 LAB — BASIC METABOLIC PANEL
Anion gap: 5 (ref 5–15)
BUN: 17 mg/dL (ref 8–23)
CO2: 24 mmol/L (ref 22–32)
Calcium: 9.3 mg/dL (ref 8.9–10.3)
Chloride: 108 mmol/L (ref 98–111)
Creatinine, Ser: 0.93 mg/dL (ref 0.61–1.24)
GFR, Estimated: >60 mL/min (ref >60)
Glucose, Bld: 111 mg/dL — ABNORMAL HIGH (ref 70–99)
Potassium: 4.4 mmol/L (ref 3.5–5.1)
Sodium: 137 mmol/L (ref 135–145)

## 2022-01-31 LAB — LIPID PANEL
Cholesterol: 125 mg/dL (ref 0–200)
HDL: 31 mg/dL — ABNORMAL LOW (ref >40)
LDL Cholesterol: 74 mg/dL (ref 0–99)
Total CHOL/HDL Ratio: 4 RATIO
Triglycerides: 99 mg/dL (ref <150)
VLDL: 20 mg/dL (ref 0–40)

## 2022-01-31 NOTE — Patient Instructions (Signed)
Medication Instructions:   Your physician recommends that you continue on your current medications as directed. Please refer to the Current Medication list given to you today.  *If you need a refill on your cardiac medications before your next appointment, please call your pharmacy*   Lab Work:  BMET today at the medical mall:  -  Please go to the [place] Entrance at [place] in at the Registration Desk: 1st desk to the right, past the screening table  If you have labs (blood work) drawn today and your tests are completely normal, you will receive your results only by: [place] (if you have MyChart) OR A paper copy in the mail If you have any lab test that is abnormal or we need to change your treatment, we will call you to review the results.   Testing/Procedures:  Your physician has requested that you have a lower extremity venous duplex. This test is an ultrasound of the veins in the legs or arms. It looks at venous blood flow that carries blood from the heart to the legs or arms. Allow one hour for a Lower Venous exam. Allow thirty minutes for an Upper Venous exam. There are no restrictions or special instructions.    Follow-Up: At [facility], you and your health needs are our priority.  As part of our continuing mission to provide you with exceptional heart care, we have created designated Provider Care Teams.  These Care Teams include your primary Cardiologist (physician) and Advanced Practice Providers (APPs -  Physician Assistants and Nurse Practitioners) who all work together to provide you with the care you need, when you need it.  We recommend signing up for the patient portal called "MyChart".  Sign up information is provided on this After Visit Summary.  MyChart is used to connect with patients for Virtual Visits (Telemedicine).  Patients are able to view lab/test results, encounter notes, upcoming appointments, etc.  Non-urgent messages can be sent to  your provider as well.   To learn more about what you can do with MyChart, go to [email or web address].    Your next appointment:   3 month(s)  The format for your next appointment:   In Person  Provider:   You may see Ida Rogue, MD or one of the following Advanced Practice Providers on your designated Care Team:   Murray Hodgkins, NP Christell Faith, PA-C Cadence Kathlen Mody, [place]  Important Information About Sugar

## 2022-02-01 ENCOUNTER — Telehealth: Payer: Self-pay | Admitting: *Deleted

## 2022-02-01 NOTE — Telephone Encounter (Signed)
Attempted to call pt. No answer. Lmtcb.  

## 2022-02-01 NOTE — Telephone Encounter (Signed)
-----   Message from Theora Gianotti, NP sent at 01/31/2022  6:18 PM EDT ----- Liver enzymes ok.  LDL cholesterol improved on higher dose of crestor but not quite at goal - currently 74 w/ goal < 70.  I strongly recommend lifestyle modifications, exercise, wt mgmt over adding additional medicine at this time, as he is so close to goal.  If lifestyle modifications are inadequate, we can always consider adding zetia '10mg'$  daily, though I think he'd be better served from an effort at modifications first.

## 2022-02-02 NOTE — Telephone Encounter (Signed)
Reviewed results and recommendations with patient and he prefers to try lifestyle modifications first. He verbalized understanding of our conversation with no further questions at this time.

## 2022-02-09 ENCOUNTER — Ambulatory Visit (INDEPENDENT_AMBULATORY_CARE_PROVIDER_SITE_OTHER): Payer: BC Managed Care – PPO

## 2022-02-09 DIAGNOSIS — M79605 Pain in left leg: Secondary | ICD-10-CM

## 2022-02-09 DIAGNOSIS — Z86718 Personal history of other venous thrombosis and embolism: Secondary | ICD-10-CM

## 2022-04-17 ENCOUNTER — Encounter (INDEPENDENT_AMBULATORY_CARE_PROVIDER_SITE_OTHER): Payer: Self-pay

## 2022-05-04 ENCOUNTER — Ambulatory Visit: Payer: BC Managed Care – PPO | Attending: Medical | Admitting: Medical

## 2022-05-04 ENCOUNTER — Encounter: Payer: Self-pay | Admitting: Medical

## 2022-05-04 VITALS — BP 122/80 | HR 77 | Ht 74.0 in | Wt 221.0 lb

## 2022-05-04 DIAGNOSIS — Z86711 Personal history of pulmonary embolism: Secondary | ICD-10-CM

## 2022-05-04 DIAGNOSIS — I25119 Atherosclerotic heart disease of native coronary artery with unspecified angina pectoris: Secondary | ICD-10-CM | POA: Diagnosis not present

## 2022-05-04 DIAGNOSIS — D6851 Activated protein C resistance: Secondary | ICD-10-CM | POA: Diagnosis not present

## 2022-05-04 NOTE — Patient Instructions (Signed)
Medication Instructions:  No changes at this time.   *If you need a refill on your cardiac medications before your next appointment, please call your pharmacy*   Lab Work: None  If you have labs (blood work) drawn today and your tests are completely normal, you will receive your results only by: [place] (if you have MyChart) OR A paper copy in the mail If you have any lab test that is abnormal or we need to change your treatment, we will call you to review the results.   Testing/Procedures: None   Follow-Up: At [facility], you and your health needs are our priority.  As part of our continuing mission to provide you with exceptional heart care, we have created designated Provider Care Teams.  These Care Teams include your primary Cardiologist (physician) and Advanced Practice Providers (APPs -  Physician Assistants and Nurse Practitioners) who all work together to provide you with the care you need, when you need it.   Your next appointment:   6 month(s)  The format for your next appointment:   In Person  Provider:   Ida Rogue, MD or Cadence Kathlen Mody, [place]       Important Information About Sugar

## 2022-05-04 NOTE — Progress Notes (Signed)
Cardiology Office Note:    Date:  05/04/2022   ID:  Roberto Pope, DOB 26-Nov-1960, MRN [id number]  PCP:  Juluis Pitch, MD  [facility] Cardiologist:  Ida Rogue, MD  [facility] Electrophysiologist:  None   Referring MD: Juluis Pitch, MD   Chief Complaint: 3 month follow-up  History of Present Illness:    Roberto Pope is a 61 y.o. male with a hx of CAD medically managed as outlined below, recently diagnosed PE in 09/2021 status post pulmonary thrombectomy on 09/21/2021 on apixaban with factor V Leiden, and HLD who presents for follow-up of LHC.   He was admitted to the hospital in 09/2021 with submassive saddle PE and underwent pulmonary thrombectomy on 09/21/2021.  High-sensitivity troponin peaked at 1698.  In hospital follow-up on 09/27/2021 he was feeling better, though did continue to note dyspnea.   Coronary CTA on 09/26/2021 showed persistent large clot burden bilaterally with early subacute pulmonary emboli with decreased clot burden since 09/21/2021.  Ill-defined nodular opacity in the peripheral basilar left lower lobe was not substantially changed and favored evolving pulmonary infarct.  Calcium score of 0 with 99% to 100% noncalcified plaque causing severe mid LAD stenosis.  Given coronary CTA findings, he underwent LHC on 10/04/2021 which showed severe single-vessel CAD with CTO of the mid LAD.  The distal LAD was supplied by robust right to left collaterals.  There was mild to moderate nonobstructive disease otherwise involving the proximal LAD, LCx, and RCA.  Normal LVEDP.  Medical therapy was recommended with suspicion for the patient's fatigue and dyspnea as well as elevated troponin felt to be due to submassive PE and demand ischemia.  Echo on 10/13/2021 showed an EF of 60 to 65%, no regional wall motion abnormalities, indeterminate LV diastolic function parameters, normal RV systolic function and ventricular cavity size, normal PASP estimated at 21.3 mmHg, no  significant valvular abnormalities, and an estimated right atrial pressure of 3 mmHg.   Patient was last seen 01/31/2022 and was overall doing well.  He reported vague chest tightness not needing sublingual nitroglycerin.  Patient also reported leg cramps.  DVT study was ordered, which was negative.  Today, the patient is overall doing well. He denies chest pain, shortness of breath, lower leg edema and orthopnea. No bleeding issues with Eliquis. BP and heart rate are good. No further leg cramps. He has lost a little of weight, about 10lbs. He has weaned off Paxil and this may be contributing to weight loss. He has been very active as well.  Past Medical History:  Diagnosis Date   CAD (coronary artery disease)    Depression    Pulmonary embolism ([place])    Restless leg     Past Surgical History:  Procedure Laterality Date   LEFT HEART CATH AND CORONARY ANGIOGRAPHY Left 10/04/2021   Procedure: LEFT HEART CATH AND CORONARY ANGIOGRAPHY;  Surgeon: Nelva Bush, MD;  Location: [place] [facility];  Service: Cardiovascular;  Laterality: Left;   PULMONARY THROMBECTOMY Bilateral 09/21/2021   Procedure: PULMONARY THROMBECTOMY;  Surgeon: Algernon Huxley, MD;  Location: [place] [facility];  Service: Cardiovascular;  Laterality: Bilateral;    Current Medications: Current Meds  Medication Sig   apixaban (ELIQUIS) 5 MG TABS tablet Take 1 tablet (5 mg total) by mouth 2 (two) times daily.   pramipexole (MIRAPEX) 0.5 MG tablet Take 0.5 mg by mouth daily after lunch.   rosuvastatin (CRESTOR) 40 MG tablet Take 1 tablet (40 mg total) by mouth daily.  Allergies:   Patient has no known allergies.   Social History   Socioeconomic History   Marital status: Married    Spouse name: Not on file   Number of children: Not on file   Years of education: Not on file   Highest education level: Not on file  Occupational History   Not on file  Tobacco Use   Smoking status: Never   Smokeless tobacco: Not on  file  Vaping Use   Vaping Use: Never used  Substance and Sexual Activity   Alcohol use: Not Currently   Drug use: Never   Sexual activity: Not on file  Other Topics Concern   Not on file  Social History Narrative   Not on file   Social Determinants of Health   Financial Resource Strain: Not on file  Food Insecurity: Not on file  Transportation Needs: Not on file  Physical Activity: Not on file  Stress: Not on file  Social Connections: Not on file     Family History: The patient's family history includes Pulmonary embolism in his mother and nephew.  ROS:   Please see the history of present illness.     All other systems reviewed and are negative.  EKGs/Labs/Other Studies Reviewed:    The following studies were reviewed today:  Echo 10/13/21 1. Left ventricular ejection fraction, by estimation, is 60 to 65%. The  left ventricle has normal function. The left ventricle has no regional  wall motion abnormalities. Left ventricular diastolic parameters are  indeterminate.   2. Right ventricular systolic function is normal. The right ventricular  size is normal. There is normal pulmonary artery systolic pressure. The  estimated right ventricular systolic pressure is 58.5 mmHg.   3. The mitral valve is normal in structure. No evidence of mitral valve  regurgitation. No evidence of mitral stenosis.   4. The aortic valve is normal in structure. Aortic valve regurgitation is  not visualized. No aortic stenosis is present.   5. The inferior vena cava is normal in size with greater than 50%  respiratory variability, suggesting right atrial pressure of 3 mmHg.   Cardiac cath 09/2021 Conclusions: Severe single-vessel coronary artery disease with chronic total occlusion of the mid LAD.  Distal LAD is supplied by robust right-to-left collaterals. Mild-moderate, nonobstructive coronary artery disease involving the proximal LAD, LCx, and RCA. Normal left ventricular filling pressure  (LVEDP 10 mmHg).   Recommendations: Continue medical therapy.  I suspect recent dyspnea/fatigue and elevated troponin are due to submassive pulmonary embolism and demand ischemia. Aggressive secondary prevention of coronary artery disease.  Recommend adding statin therapy to target LDL less than 70. Restart apixaban 5 mg twice daily this evening if no evidence of bleeding or vascular injury at right radial catheterization site.   Nelva Bush, MD [facility]     Antiplatelet/Anticoag Recommend to resume Apixaban, at currently prescribed dose and frequency on 10/04/2021. Concurrent antiplatelet therapy not recommended.  Discharge Date In the absence of any other complications or medical issues, we expect the patient to be ready for discharge from a cath perspective on 10/04/2021.    EKG:  EKG is not ordered today.    Recent Labs: 10/27/2021: Hemoglobin 14.2; Platelets 207 01/31/2022: ALT 30; BUN 17; Creatinine, Ser 0.93; Potassium 4.4; Sodium 137  Recent Lipid Panel    Component Value Date/Time   CHOL 125 01/31/2022 0850   TRIG 99 01/31/2022 0850   HDL 31 (L) 01/31/2022 0850   CHOLHDL 4.0 01/31/2022 0850  VLDL 20 01/31/2022 0850   LDLCALC 74 01/31/2022 0850   LDLDIRECT 82.8 10/27/2021 0846     Physical Exam:    VS:  BP 122/80 (BP Location: Left Arm, Patient Position: Sitting, Cuff Size: Normal)   Pulse 77   Ht '6\' 2"'$  (1.88 m)   Wt 221 lb (100.2 kg)   SpO2 97%   BMI 28.37 kg/m     Wt Readings from Last 3 Encounters:  05/04/22 221 lb (100.2 kg)  01/31/22 225 lb 9.6 oz (102.3 kg)  10/27/21 229 lb 2 oz (103.9 kg)     GEN:  Well nourished, well developed in no acute distress HEENT: Normal NECK: No JVD; No carotid bruits LYMPHATICS: No lymphadenopathy CARDIAC: RRR, no murmurs, rubs, gallops RESPIRATORY:  Clear to auscultation without rales, wheezing or rhonchi  ABDOMEN: Soft, non-tender, non-distended MUSCULOSKELETAL:  No edema; No deformity  SKIN: Warm and  dry NEUROLOGIC:  Alert and oriented x 3 PSYCHIATRIC:  Normal affect   ASSESSMENT:    1. Coronary artery disease involving native coronary artery of native heart with angina pectoris ([place])   2. History of pulmonary embolism   3. Factor V Leiden ([place])    PLAN:    In order of problems listed above:  CAD Patient denies chest pain or shortness of breath. He has staying very active and lost 10lbs. LHC 09/2021 showed 1v CAD with CTO of the LAD with collaterals and otherwise mild to moderate nonobstructive CAD, recommended medical management. No further ischemic work-up indicated at this time. No ASA with Eliquis. Continue statin therapy. No BB with baseline bradycardia.   H/o PE s/p pulmonary thrombectomy Factor V Leiden He reports compliance with Eliquis. Continue Eliquis '5mg'$  BID. Follows with hematology.   HLD Repeat LDL 74. Continue Crestor. The patient has lost about 10 lbs and is staying active. No changes today.   Disposition: Follow up in 6 month(s) with MD/APP    Signed, Solange Emry Ninfa Meeker, PA-C  05/04/2022 2:09 PM    [facility]

## 2022-08-14 ENCOUNTER — Other Ambulatory Visit: Payer: Self-pay | Admitting: Internal Medicine

## 2022-10-16 ENCOUNTER — Other Ambulatory Visit: Payer: Self-pay | Admitting: Internal Medicine

## 2022-11-05 NOTE — Progress Notes (Signed)
Cardiology Office Note  Date:  11/06/2022   ID:  Roberto Pope, DOB September 23, 1960, MRN [id number]  PCP:  Dorothey Baseman, MD   Chief Complaint  Patient presents with   6 month follow up     Patient c/o chest tightness at times. Medications reviewed by the patient verbally.     HPI:  Mr Roberto Pope is a 62 year old gentleman with past medical history of Hospitalization April 2023 with acute PE with right heart strain after train trip to [place], small infarct in the posterior lateral left lower lobe Status post thrombectomy Factor V Leiden on chronic anticoagulation Elevated troponin 1600,  Calcium score of 0 with 99% to 100% noncalcified plaque causing severe mid LAD stenosis.   LHC on 10/04/2021 which showed severe single-vessel CAD with CTO of the mid LAD.   distal LAD was supplied by robust right to left collaterals.  There was mild to moderate nonobstructive disease otherwise involving the proximal LAD, LCx, and RCA.  Normal LVEDP.  OSA, back on CPAP He presents for follow-up of his coronary artery disease, history of PE  LOV by myself 4/23 Seen by one of our providers November 2023 On that visit had lost 10 pounds  On discussions today, reports he is tired in the afternoon Works in [place], feels like he needs a nap in the afternoon Prior diagnosis of sleep apnea, back on CPAP Trying to see if it helps his afternoon fatigue but has not helped much Waking at 4 AM  [facility], thinks it makes him feel worse given leg neuropathy  Stopped going to gym as frequently Doing more landscaping  Lab work reviewed from August 2023 Total cholesterol 125 LDL 74 on Crestor 40 daily  EKG personally reviewed by myself on todays visit Sinus bradycardia rate 50 no significant ST-T wave changes  Other past medical history reviewed trip on a train up to [place] end of March 2023, shortly after diagnosed with PE Hypercoagulable panel :Leiden factor V  Initial CT scan in the hospital with acute  pulmonary embolism right heart strain on CT, submassive saddle PE Symptomatically felt much better after thrombectomy Echocardiogram is still pending, unable to be performed in the hospital  For elevated troponin, cardiac CTA was ordered occlusion in the mid LAD after diagonal branch  Cardiac catheterization followed confirming details  PMH:   has a past medical history of CAD (coronary artery disease), Depression, Pulmonary embolism (HCC), and Restless leg.  PSH:   The histories are not reviewed yet. Please review them in the "History" navigator section and refresh this SmartLink.  Current Outpatient Medications  Medication Sig Dispense Refill   apixaban (ELIQUIS) 5 MG TABS tablet Take 1 tablet (5 mg total) by mouth 2 (two) times daily. 60 tablet 0   pramipexole (MIRAPEX) 0.5 MG tablet Take 0.5 mg by mouth daily after lunch.     rosuvastatin (CRESTOR) 40 MG tablet Take 1 tablet (40 mg total) by mouth daily. 90 tablet 3   PARoxetine (PAXIL) 30 MG tablet Take 10 mg by mouth daily. (Patient not taking: Reported on 11/06/2022)     No current facility-administered medications for this visit.   Facility-Administered Medications Ordered in Other Visits  Medication Dose Route Frequency Provider Last Rate Last Admin   sodium chloride flush (NS) 0.9 % injection 3 mL  3 mL Intravenous Q12H Camiah Humm, Tollie Pizza, MD         Allergies:   Patient has no known allergies.   Social History:  The patient  reports  that he has never smoked. He does not have any smokeless tobacco history on file. He reports that he does not currently use alcohol. He reports that he does not use drugs.   Family History:   family history includes Pulmonary embolism in his mother and nephew.    Review of Systems: Review of Systems  Constitutional:  Positive for malaise/fatigue.  HENT: Negative.    Respiratory: Negative.    Cardiovascular: Negative.   Gastrointestinal: Negative.   Musculoskeletal: Negative.    Neurological: Negative.   Psychiatric/Behavioral: Negative.    All other systems reviewed and are negative.   PHYSICAL EXAM: VS:  BP 110/70 (BP Location: Left Arm, Patient Position: Sitting, Cuff Size: Normal)   Ht 6\' 2"  (1.88 m)   Wt 227 lb 6 oz (103.1 kg)   SpO2 98%   BMI 29.19 kg/m  , BMI Body mass index is 29.19 kg/m. GEN: Well nourished, well developed, in no acute distress HEENT: normal Neck: no JVD, carotid bruits, or masses Cardiac: RRR; no murmurs, rubs, or gallops,no edema  Respiratory:  clear to auscultation bilaterally, normal work of breathing GI: soft, nontender, nondistended, + BS MS: no deformity or atrophy Skin: warm and dry, no rash Neuro:  Strength and sensation are intact Psych: euthymic mood, full affect  Recent Labs: 01/31/2022: ALT 30; BUN 17; Creatinine, Ser 0.93; Potassium 4.4; Sodium 137    Lipid Panel Lab Results  Component Value Date   CHOL 125 01/31/2022   HDL 31 (L) 01/31/2022   LDLCALC 74 01/31/2022   TRIG 99 01/31/2022      Wt Readings from Last 3 Encounters:  11/06/22 227 lb 6 oz (103.1 kg)  05/04/22 221 lb (100.2 kg)  01/31/22 225 lb 9.6 oz (102.3 kg)       ASSESSMENT AND PLAN:  Problem List Items Addressed This Visit       Cardiology Problems   Hyperlipidemia   Other Visit Diagnoses     Coronary artery disease involving native coronary artery of native heart with angina pectoris (HCC)    -  Primary   History of pulmonary embolism       Factor V Leiden (HCC)       History of DVT of lower extremity       Hyperlipidemia LDL goal <70         Coronary artery disease with stable angina Cardiac catheterization with occluded mid LAD collaterals from right to left Fatigue in the afternoon, stable Recommend he continue exercise program  Hyperlipidemia Continue Crestor 40 daily, add Zetia 10 mg daily to achieve goal LDL less than 55  Leiden factor V On Eliquis 5 twice daily  Sleep apnea on CPAP Difficulty tolerating  CPAP, wakes him at 4 AM May be contributing to fatigue   Total encounter time more than 30 minutes  Greater than 50% was spent in counseling and coordination of care with the patient    Signed, Dossie Arbour, M.D., Ph.D. [facility] [place], [place] [phone]

## 2022-11-06 ENCOUNTER — Ambulatory Visit: Payer: BC Managed Care – PPO | Attending: Cardiovascular Disease | Admitting: Cardiovascular Disease

## 2022-11-06 ENCOUNTER — Encounter: Payer: Self-pay | Admitting: Cardiovascular Disease

## 2022-11-06 VITALS — BP 110/70 | HR 50 | Ht 74.0 in | Wt 227.4 lb

## 2022-11-06 DIAGNOSIS — Z86718 Personal history of other venous thrombosis and embolism: Secondary | ICD-10-CM

## 2022-11-06 DIAGNOSIS — I25119 Atherosclerotic heart disease of native coronary artery with unspecified angina pectoris: Secondary | ICD-10-CM | POA: Diagnosis not present

## 2022-11-06 DIAGNOSIS — E785 Hyperlipidemia, unspecified: Secondary | ICD-10-CM

## 2022-11-06 DIAGNOSIS — Z86711 Personal history of pulmonary embolism: Secondary | ICD-10-CM

## 2022-11-06 DIAGNOSIS — E782 Mixed hyperlipidemia: Secondary | ICD-10-CM

## 2022-11-06 DIAGNOSIS — D6851 Activated protein C resistance: Secondary | ICD-10-CM | POA: Diagnosis not present

## 2022-11-06 MED ORDER — EZETIMIBE 10 MG PO TABS: 10.0000 mg | ORAL_TABLET | Freq: Every day | ORAL | 3 refills | Status: DC

## 2022-11-06 NOTE — Patient Instructions (Signed)
Medication Instructions:  °Please start  °zetia 10 mg daily for cholesterol ° °If you need a refill on your cardiac medications before your next appointment, please call your pharmacy.  ° °Lab work: °No new labs needed ° °Testing/Procedures: °No new testing needed ° °Follow-Up: °At [facility], you and your health needs are our priority.  As part of our continuing mission to provide you with exceptional heart care, we have created designated Provider Care Teams.  These Care Teams include your primary Cardiologist (physician) and Advanced Practice Providers (APPs -  Physician Assistants and Nurse Practitioners) who all work together to provide you with the care you need, when you need it. ° °You will need a follow up appointment in 12 months ° °Providers on your designated Care Team:   °Christopher Berge, NP °Ryan Dunn, PA-C °Cadence Furth, PA-C ° °COVID-19 Vaccine Information can be found at: [email or web address] For questions related to vaccine distribution or appointments, please email [email or web address] or call [phone].  ° °

## 2023-05-07 ENCOUNTER — Encounter: Payer: Self-pay | Admitting: Cardiovascular Disease

## 2023-05-11 ENCOUNTER — Other Ambulatory Visit: Payer: Self-pay | Admitting: Orthopedic Surgery

## 2023-05-11 DIAGNOSIS — M51369 Other intervertebral disc degeneration, lumbar region without mention of lumbar back pain or lower extremity pain: Secondary | ICD-10-CM

## 2023-05-11 DIAGNOSIS — M48061 Spinal stenosis, lumbar region without neurogenic claudication: Secondary | ICD-10-CM

## 2023-05-11 DIAGNOSIS — M51362 Other intervertebral disc degeneration, lumbar region with discogenic back pain and lower extremity pain: Secondary | ICD-10-CM

## 2023-05-11 DIAGNOSIS — M47816 Spondylosis without myelopathy or radiculopathy, lumbar region: Secondary | ICD-10-CM

## 2023-05-11 DIAGNOSIS — M25551 Pain in right hip: Secondary | ICD-10-CM

## 2023-07-10 IMAGING — CT CT HEART MORP W/ CTA COR W/ SCORE W/ CA W/CM &/OR W/O CM
1 of 14 series · 3 of 20 positions shown, 4 images · non-contrast
Comparison: 09/21/2021 chest CT angiogram.

Addendum:
CLINICAL DATA: Chest pain

EXAM:
Cardiac/Coronary  CTA
TECHNIQUE: The patient was scanned on a Siemens Somatom go.Top scanner.

[Series 4: multiphase % cta coronary 0.60 · axial · 0.43mm/px · z∈[-1102,-1043]mm · 3 of 3256 slices shown, 4 images]
[im 814/3256  vessel]
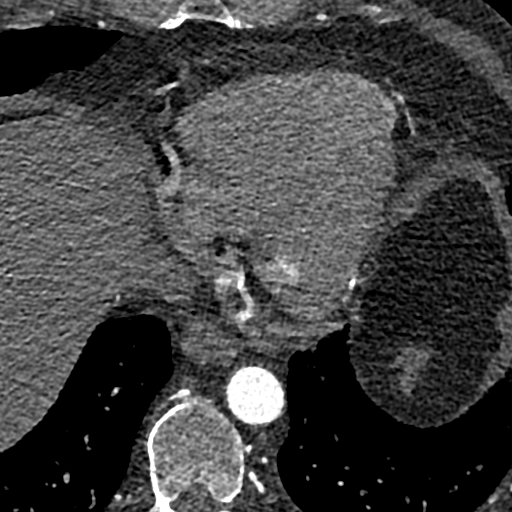
[im 814/3256  lung]
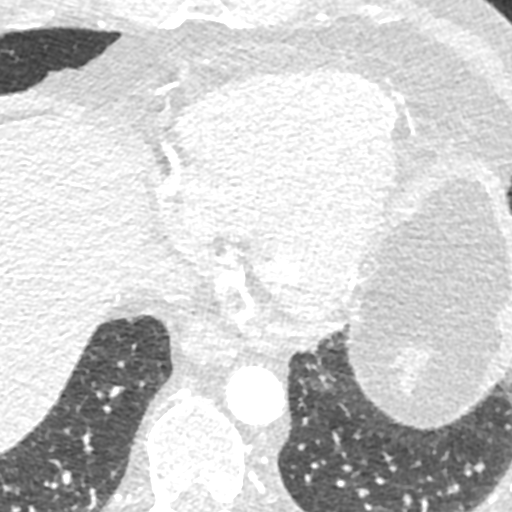
[im 1628/3256  vessel]
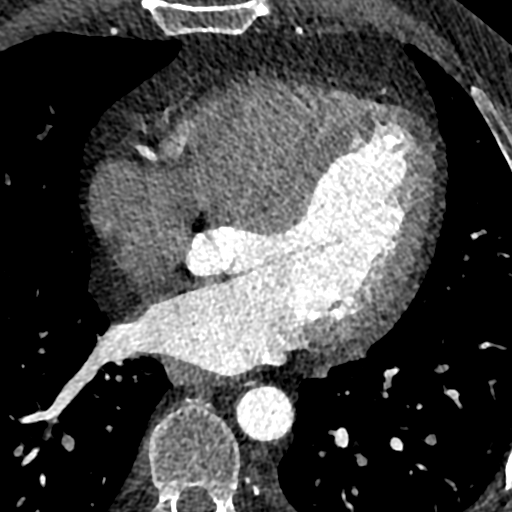
[im 2442/3256  vessel]
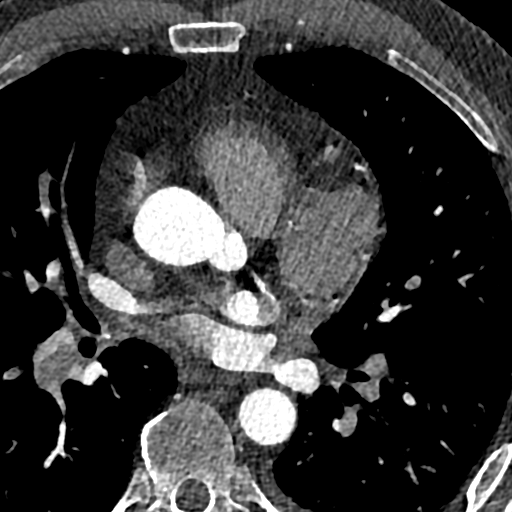

[3 of 20 positions shown; findings below may reference images not displayed]



Aortic Valve:  Trileaflet.  No calcifications.

Coronary Arteries:  Normal coronary origin.  Right dominance.

RCA is a dominant artery that gives rise to PDA and PLA. There is no
plaque.

Left main is a large artery that gives rise to LAD and LCX arteries.
There is no LM disease

LAD has along segment of non calcified plaque in the mid segment
causing severe stenosis (99-100)%.

LCX is a non-dominant artery that gives rise to two obtuse branches.
There is no plaque.

Other findings:

Normal pulmonary vein drainage into the left atrium.

Normal left atrial appendage without a thrombus.

Normal size of the pulmonary artery. Filling defects noted in the
left and right pulmonary arteries suggesting PE.
IMPRESSION: 1. Coronary calcium score of 0.

2. Normal coronary origin with right dominance.

3. Non calcified plaque causing severe mid LAD stenosis (99-100%)

4. Bilateral filling defects noted in the pulmonary arteries
suggesting pulmonary embolus (PE). Recommend dedicated CTA chest PE
protocol for further eval.

5. CAD-RADS 4 Severe stenosis. (70-99% or > 50% left main). Cardiac
catheterization is recommended. Consider symptom-guided
anti-ischemic pharmacotherapy as well as risk factor modification
per guideline directed care.

EXAM:
OVER-READ INTERPRETATION  CT CHEST

The following report is an over-read performed by radiologist Dr.
does not include interpretation of cardiac or coronary anatomy or
pathology. The coronary CTA interpretation by the cardiologist is
attached.
FINDINGS: Cardiovascular: Normal heart size. No significant pericardial
effusion/thickening. Great vessels are normal in course and caliber.
Persistent large clot burden bilateral pulmonary emboli involving
lobar, segmental pulmonary artery branches throughout all lung lobes
and involving the distal right pulmonary artery, decreased in clot
burden since 09/21/2021 CT.

Mediastinum/Nodes: Unremarkable esophagus. No pathologically
enlarged mediastinal or hilar lymph nodes.

Lungs/Pleura: No pneumothorax. Trace dependent left pleural
effusion, stable. No right pleural effusion. Ill-defined focal
cm nodular opacity in the peripheral basilar left lower lobe (series
10/image 36), not substantially changed in size from 09/21/2021
chest CT although mildly increased in consolidation. Otherwise no
significant pulmonary nodules.

Upper abdomen: Small hiatal hernia.

Musculoskeletal: No aggressive appearing focal osseous lesions.
Moderate thoracic spondylosis.
IMPRESSION: 1. Persistent large clot burden bilateral early subacute pulmonary
emboli, decreased in clot burden since 09/21/2021 CT.
2. Ill-defined focal nodular opacity in the peripheral basilar left
lower lobe, not substantially changed in size from 09/21/2021 chest
CT although mildly increased in consolidation, favor evolving
pulmonary infarct. Recommend attention on follow-up chest CT in 3
months.
3. Trace dependent left pleural effusion, stable.
4. Small hiatal hernia.



Aortic Valve:  Trileaflet.  No calcifications.

Coronary Arteries:  Normal coronary origin.  Right dominance.

RCA is a dominant artery that gives rise to PDA and PLA. There is no
plaque.

Left main is a large artery that gives rise to LAD and LCX arteries.
There is no LM disease

LAD has along segment of non calcified plaque in the mid segment
causing severe stenosis (99-100)%.

LCX is a non-dominant artery that gives rise to two obtuse branches.
There is no plaque.

Other findings:

Normal pulmonary vein drainage into the left atrium.

Normal left atrial appendage without a thrombus.

Normal size of the pulmonary artery. Filling defects noted in the
left and right pulmonary arteries suggesting PE.
IMPRESSION: 1. Coronary calcium score of 0.

2. Normal coronary origin with right dominance.

3. Non calcified plaque causing severe mid LAD stenosis (99-100%)

4. Bilateral filling defects noted in the pulmonary arteries
suggesting pulmonary embolus (PE). Recommend dedicated CTA chest PE
protocol for further eval.

5. CAD-RADS 4 Severe stenosis. (70-99% or > 50% left main). Cardiac
catheterization is recommended. Consider symptom-guided
anti-ischemic pharmacotherapy as well as risk factor modification
per guideline directed care.

## 2023-08-08 ENCOUNTER — Other Ambulatory Visit: Payer: Self-pay | Admitting: Cardiovascular Disease

## 2023-10-03 ENCOUNTER — Other Ambulatory Visit: Payer: Self-pay | Admitting: Family Medicine

## 2023-10-03 ENCOUNTER — Inpatient Hospital Stay: Admission: RE | Admit: 2023-10-03 | Source: Ambulatory Visit

## 2023-10-03 ENCOUNTER — Ambulatory Visit
Admission: RE | Admit: 2023-10-03 | Discharge: 2023-10-03 | Disposition: A | Discharge status: Home / Self Care | Source: Ambulatory Visit | Attending: Family Medicine | Admitting: Family Medicine

## 2023-10-03 DIAGNOSIS — R051 Acute cough: Secondary | ICD-10-CM

## 2023-10-16 ENCOUNTER — Other Ambulatory Visit: Payer: Self-pay | Admitting: Cardiovascular Disease

## 2023-11-15 ENCOUNTER — Ambulatory Visit: Attending: Cardiology | Admitting: Cardiology

## 2023-11-15 ENCOUNTER — Encounter: Payer: Self-pay | Admitting: Cardiology

## 2023-11-15 VITALS — BP 124/82 | HR 48 | Ht 74.0 in | Wt 229.4 lb

## 2023-11-15 DIAGNOSIS — Z86718 Personal history of other venous thrombosis and embolism: Secondary | ICD-10-CM | POA: Diagnosis not present

## 2023-11-15 DIAGNOSIS — I25119 Atherosclerotic heart disease of native coronary artery with unspecified angina pectoris: Secondary | ICD-10-CM

## 2023-11-15 DIAGNOSIS — Z86711 Personal history of pulmonary embolism: Secondary | ICD-10-CM

## 2023-11-15 DIAGNOSIS — D6851 Activated protein C resistance: Secondary | ICD-10-CM | POA: Diagnosis not present

## 2023-11-15 DIAGNOSIS — E785 Hyperlipidemia, unspecified: Secondary | ICD-10-CM

## 2023-11-15 DIAGNOSIS — R001 Bradycardia, unspecified: Secondary | ICD-10-CM

## 2023-11-15 NOTE — Patient Instructions (Signed)
 Medication Instructions:  Your physician recommends the following medication changes.  STOP TAKING: Zetia   *If you need a refill on your cardiac medications before your next appointment, please call your pharmacy*  Lab Work: No labs ordered today  If you have labs (blood work) drawn today and your tests are completely normal, you will receive your results only by: MyChart Message (if you have MyChart) OR A paper copy in the mail If you have any lab test that is abnormal or we need to change your treatment, we will call you to review the results.  Testing/Procedures: No test ordered today   Follow-Up: At [facility], you and your health needs are our priority.  As part of our continuing mission to provide you with exceptional heart care, our providers are all part of one team.  This team includes your primary Cardiologist (physician) and Advanced Practice Providers or APPs (Physician Assistants and Nurse Practitioners) who all work together to provide you with the care you need, when you need it.  Your next appointment:   12 month(s)  Provider:   Timothy Gollan, MD or Ronald Cockayne, NP

## 2023-11-15 NOTE — Progress Notes (Signed)
 Cardiology Office Note:  .   Date:  11/15/2023  ID:  Roberto Pope, DOB 1960/11/11, MRN [id number] PCP: Rory Collard, MD  [facility] Providers Cardiologist:  Belva Boyden, MD    History of Present Illness: .   Roberto Pope is a 63 y.o. male with a past medical history of coronary artery disease, medically managed, diagnosed with PE (01/2022) status post pulmonary thrombectomy (/5/23) on apixaban  and factor V Leiden, hyperlipidemia who presents today for follow-up.   He was admitted to the hospital 09/2021 with submassive saddle PE and underwent pulmonary thrombectomy on 09/21/2021.  High-sensitivity troponin peaked at 1698.  In hospital follow-up on 09/27/21 was feeling better.  Coronary CTA 09/26/2021 showed persistent large clot burden bilaterally with early subacute pulmonary emboli with decreased clot burden 11/28/2021.  Ill-defined nodular opacity in the peripheral basilar left lower lobe was not substantially changed and favored involving pulmonary infarct.  Calcium  score of 0 with 99% 200% noncalcified plaque causing severe mid LAD stenosis.  Given coronary CTA findings, he underwent left heart catheterization 10/04/2021 which showed severe single-vessel CAD with CTO of the mid LAD.  The distal LAD was supplied by robust right to left collaterals.  There was mild to moderate nonobstructive disease otherwise of the proximal LAD, left circumflex, and RCA.  Normal LVEDP, medical therapy was recommended with suspicion for the patient's fatigue and dyspnea as well as elevated troponin felt to be due to submassive PE and demand ischemia.  Echocardiogram 10/13/2021 showed an EF of 60 to 65%, no RWMA, indeterminate LV diastolic function parameters, normal RV systolic function and ventricular cavity size, normal PASP estimated 21.3 mmHg, no significant valvular abnormalities.  Evaluated in clinic 01/31/2022 was overall doing well from a cardiac perspective.  Vague chest tightness not needing  sublingual nitroglycerin  but reported leg cramps.  DVT study was ordered which resulted as negative.   He was last seen in clinic 11/06/2022 by Dr. Gollan.  At that time he complained of chest tightness at times.  There were no medication changes that were made and no further testing that was ordered at that time.  He returns to clinic today stating that he has been doing well from a cardiac perspective.  He denies any chest pain, shortness of breath, palpitations, peripheral edema.  States that he has been compliant with his current medication with any undue side effects.  States that he has not missed any of his apixaban  denies any bleeding with no blood noted in the urine or stool.  Does have questions today about stopping his ezetimibe  as his cholesterol has not been well-controlled.  Denies any hospitalizations or visits to the emergency department.  ROS: 10 point review of system has been reviewed and considered negative except ones were listed in the HPI  Studies Reviewed: Aaron Aas   EKG Interpretation Date/Time:  Thursday Nov 15 2023 08:27:43 EDT Ventricular Rate:  48 PR Interval:  206 QRS Duration:  98 QT Interval:  416 QTC Calculation: 371 R Axis:   -11  Text Interpretation: Sinus bradycardia When compared with ECG of 21-Sep-2021 12:23, PREVIOUS ECG IS PRESENT Confirmed by Ronald Cockayne ([id number]) on 11/15/2023 8:33:58 AM    2D echo 10/13/2021 1. Left ventricular ejection fraction, by estimation, is 60 to 65%. The  left ventricle has normal function. The left ventricle has no regional  wall motion abnormalities. Left ventricular diastolic parameters are  indeterminate.   2. Right ventricular systolic function is normal. The right ventricular  size is normal. There  is normal pulmonary artery systolic pressure. The  estimated right ventricular systolic pressure is 21.3 mmHg.   3. The mitral valve is normal in structure. No evidence of mitral valve  regurgitation. No evidence of mitral  stenosis.   4. The aortic valve is normal in structure. Aortic valve regurgitation is  not visualized. No aortic stenosis is present.   5. The inferior vena cava is normal in size with greater than 50%  respiratory variability, suggesting right atrial pressure of 3 mmHg.   LHC 10/04/2021 Conclusions: Severe single-vessel coronary artery disease with chronic total occlusion of the mid LAD.  Distal LAD is supplied by robust right-to-left collaterals. Mild-moderate, nonobstructive coronary artery disease involving the proximal LAD, LCx, and RCA. Normal left ventricular filling pressure (LVEDP 10 mmHg).   Recommendations: Continue medical therapy.  I suspect recent dyspnea/fatigue and elevated troponin are due to submassive pulmonary embolism and demand ischemia. Aggressive secondary prevention of coronary artery disease.  Recommend adding statin therapy to target LDL less than 70. Restart apixaban  5 mg twice daily this evening if no evidence of bleeding or vascular injury at right radial catheterization site.  cCTA 09/26/2021 IMPRESSION: 1. Coronary calcium  score of 0.   2. Normal coronary origin with right dominance.   3. Non calcified plaque causing severe mid LAD stenosis (99-100%)   4. Bilateral filling defects noted in the pulmonary arteries suggesting pulmonary embolus (PE). Recommend dedicated CTA chest PE protocol for further eval.   5. CAD-RADS 4 Severe stenosis. (70-99% or > 50% left main). Cardiac catheterization is recommended. Consider symptom-guided anti-ischemic pharmacotherapy as well as risk factor modification per guideline directed care. Risk Assessment/Calculations:             Physical Exam:   VS:  BP 124/82   Pulse (!) 48   Ht 6\' 2"  (1.88 m)   Wt 229 lb 6.4 oz (104.1 kg)   SpO2 98%   BMI 29.45 kg/m    Wt Readings from Last 3 Encounters:  11/15/23 229 lb 6.4 oz (104.1 kg)  11/06/22 227 lb 6 oz (103.1 kg)  05/04/22 221 lb (100.2 kg)    GEN: Well  nourished, well developed in no acute distress NECK: No JVD; No carotid bruits CARDIAC: RRR, bradycardic, no murmurs, rubs, gallops RESPIRATORY:  Clear to auscultation without rales, wheezing or rhonchi  ABDOMEN: Soft, non-tender, non-distended EXTREMITIES:  No edema; No deformity   ASSESSMENT AND PLAN: .   Coronary artery disease last left heart catheterization completed in 09/2021 showed 1 vessel CAD with CTO of the LAD with collaterals and otherwise mild to moderate nonobstructive CAD recommended medical management.  He denies any angina or anginal equivalents.  He continues to remain very active.  He has continued on apixaban  5 mg twice daily and lieu of aspirin  and rosuvastatin  40 mg daily and ezetimibe  10 mg daily which she has questions about stopping today.  No beta-blocker therapy with a baseline bradycardia.  No further ischemic testing needed at this time.  Mixed hyperlipidemia with an LDL of 30.  He is continued on rosuvastatin  40 mg daily.  Continues to remain active.  Ezetimibe  10 mg daily has been discontinued today.  Upcoming lipid panel in July with PCP.  He has been advised that that if he remains above goal for his cholesterol then he would have to restart ezetimibe  therapy which he is agreeable.  History of PE status post pulmonary thrombectomy and factor V Leiden which he reports compliance with apixaban .  He is  continued on apixaban  5 mg twice daily.  He continues to have follow-up with hematology.  Sinus bradycardia noted on EKG today with a rate of 48 with no acute or ischemic changes noted from prior studies.  Patient remains off of beta-blocker therapy due to baseline bradycardia.  He has chronotropically appropriate as his heart rate does increase with activity and movement and then reduces with rest.  Will continue to monitor with surveillance EKGs       Dispo: Patient to return to clinic to see MD/APP in 11 to 12 months or sooner if needed  Signed, Thersa Mohiuddin, NP

## 2024-05-13 ENCOUNTER — Emergency Department: Admission: EM | Admit: 2024-05-13 | Discharge: 2024-05-13 | Disposition: A | Discharge status: Home / Self Care

## 2024-05-13 ENCOUNTER — Encounter: Payer: Self-pay | Admitting: Cardiovascular Disease

## 2024-05-13 ENCOUNTER — Other Ambulatory Visit: Payer: Self-pay

## 2024-05-13 ENCOUNTER — Encounter: Payer: Self-pay | Admitting: *Deleted

## 2024-05-13 ENCOUNTER — Emergency Department

## 2024-05-13 DIAGNOSIS — R0789 Other chest pain: Secondary | ICD-10-CM | POA: Diagnosis present

## 2024-05-13 DIAGNOSIS — M542 Cervicalgia: Secondary | ICD-10-CM | POA: Diagnosis not present

## 2024-05-13 DIAGNOSIS — R079 Chest pain, unspecified: Secondary | ICD-10-CM

## 2024-05-13 DIAGNOSIS — I2699 Other pulmonary embolism without acute cor pulmonale: Secondary | ICD-10-CM | POA: Diagnosis not present

## 2024-05-13 DIAGNOSIS — R001 Bradycardia, unspecified: Secondary | ICD-10-CM | POA: Insufficient documentation

## 2024-05-13 DIAGNOSIS — Z7901 Long term (current) use of anticoagulants: Secondary | ICD-10-CM | POA: Insufficient documentation

## 2024-05-13 DIAGNOSIS — M25511 Pain in right shoulder: Secondary | ICD-10-CM | POA: Insufficient documentation

## 2024-05-13 DIAGNOSIS — I2510 Atherosclerotic heart disease of native coronary artery without angina pectoris: Secondary | ICD-10-CM | POA: Insufficient documentation

## 2024-05-13 LAB — BASIC METABOLIC PANEL WITH GFR
Anion gap: 8 (ref 5–15)
BUN: 12 mg/dL (ref 8–23)
CO2: 25 mmol/L (ref 22–32)
Calcium: 9.8 mg/dL (ref 8.9–10.3)
Chloride: 103 mmol/L (ref 98–111)
Creatinine, Ser: 0.85 mg/dL (ref 0.61–1.24)
GFR, Estimated: >60 mL/min (ref >60)
Glucose, Bld: 93 mg/dL (ref 70–99)
Potassium: 4.8 mmol/L (ref 3.5–5.1)
Sodium: 136 mmol/L (ref 135–145)

## 2024-05-13 LAB — PROTIME-INR
INR: 1.1 (ref 0.8–1.2)
Prothrombin Time: 14.3 s (ref 11.4–15.2)

## 2024-05-13 LAB — CBC
HCT: 42.9 % (ref 39.0–52.0)
Hemoglobin: 15 g/dL (ref 13.0–17.0)
MCH: 30 pg (ref 26.0–34.0)
MCHC: 35 g/dL (ref 30.0–36.0)
MCV: 85.8 fL (ref 80.0–100.0)
Platelets: 183 K/uL (ref 150–400)
RBC: 5 MIL/uL (ref 4.22–5.81)
RDW: 11.4 % — ABNORMAL LOW (ref 11.5–15.5)
WBC: 7.8 K/uL (ref 4.0–10.5)
nRBC: 0 % (ref 0.0–0.2)

## 2024-05-13 LAB — TROPONIN T, HIGH SENSITIVITY
Troponin T High Sensitivity: <15 ng/L (ref 0–19)
Troponin T High Sensitivity: <15 ng/L (ref 0–19)

## 2024-05-13 NOTE — ED Triage Notes (Signed)
 Pt ambulatory to triage.  Pt reports chest pain earlier today.  No sob.  No n/v/  pt states pain radiated into right shoulder and neck area.  Pt alert  speech clear.

## 2024-05-13 NOTE — ED Provider Notes (Signed)
 [facility] Emergency Department Provider Note     Event Date/Time   First MD Initiated Contact with Patient 05/13/24 1612     (approximate)   History   Chest Pain   HPI  Roberto Pope is a 63 y.o. 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  Patient reports onset of symptoms earlier today around 2 PM.  He reports symptoms lasted approximately 10 minutes.  He denies any preceding exacerbation, trauma, or provocation.SABRA  He denies any associated shortness of breath, diaphoresis, nausea, vomiting, or dizziness.  He reports referral of the pain into his right shoulder and neck.  No recent illness.  No reports of any weight change, swelling to the lower extremities.  No cough, congestion, recent travel.  He is not endorsing any acute chest pain at time of my evaluation.  Symptoms have resolved at this time.   Physical Exam   Triage Vital Signs: ED Triage Vitals  Encounter Vitals Group     BP 05/13/24 1518 (!) 140/91     Girls Systolic BP Percentile --      Girls Diastolic BP Percentile --      Boys Systolic BP Percentile --      Boys Diastolic BP Percentile --      Pulse Rate 05/13/24 1518 (!) 53     Resp 05/13/24 1518 18     Temp 05/13/24 1518 98.4 F (36.9 C)     Temp Source 05/13/24 1518 Oral     SpO2 05/13/24 1518 98 %     Weight 05/13/24 1514 225 lb (102.1 kg)     Height 05/13/24 1514 6' 2 (1.88 m)     Head Circumference --      Peak Flow --      Pain Score 05/13/24 1514 2     Pain Loc --      Pain Education --      Exclude from Growth Chart --     Most recent vital signs: Vitals:   05/13/24 1518  BP: (!) 140/91  Pulse: (!) 53  Resp: 18  Temp: 98.4 F (36.9 C)  SpO2: 98%    General Awake, no distress.  NAD HEENT NCAT. PERRL. EOMI. No rhinorrhea. Mucous membranes  are moist.  CV:  Good peripheral perfusion.  RRR.  No CCE distally RESP:  Normal effort.  CTA ABD:  No distention.   ED Results / Procedures / Treatments   Labs (all labs ordered are listed, but only abnormal results are displayed) Labs Reviewed  CBC - Abnormal; Notable for the following components:      Result Value   RDW 11.4 (*)    All other components within normal limits  BASIC METABOLIC PANEL WITH GFR  PROTIME-INR  TROPONIN T, HIGH SENSITIVITY  TROPONIN T, HIGH SENSITIVITY     EKG  Vent. rate 53 BPM  PR interval 180 ms  QRS duration 102 ms  QT/QTcB 428/401 ms  P-R-T axes 14 4 38 No STEMI  RADIOLOGY  I personally viewed and evaluated these images as part of my medical decision making, as well as reviewing the written report by the radiologist.  ED Provider Interpretation: No acute findings  DG Chest 2 View Result Date: 05/13/2024 CLINICAL DATA:  Chest pain. EXAM: CHEST - 2 VIEW COMPARISON:  10/03/2023 FINDINGS:  Both lungs are clear. Heart and mediastinum are within normal limits. Trachea is midline. No pleural effusions. Negative for a pneumothorax. No acute bone abnormality. IMPRESSION: No acute cardiopulmonary disease. Electronically Signed   By: Juliene Balder M.D.   On: 05/13/2024 16:32     PROCEDURES:  Critical Care performed: No  Procedures   MEDICATIONS ORDERED IN ED: Medications - No data to display   IMPRESSION / MDM / ASSESSMENT AND PLAN / ED COURSE  I reviewed the triage vital signs and the nursing notes.                              Differential diagnosis includes, but is not limited to, ACS, aortic dissection, pulmonary embolism, cardiac tamponade, pneumothorax, pneumonia, pericarditis, myocarditis, GI-related causes including esophagitis/gastritis, and musculoskeletal chest wall pain.     Patient's presentation is most consistent with acute complicated illness / injury requiring diagnostic workup.  Patient's diagnosis is consistent with  nonspecific chest pain and possibly angina.  Patient with reassuring cardiac workup at this time.  No acute lab abnormalities are noted.  Troponin is flat x 2.  No abnormalities of his electrolytes or WBCs.  X-ray interpreted by me, shows no evidence of any intrathoracic process.  EKG shows sinus bradycardia without evidence of malignant arrhythmia.  Patient and his wife are reassured about a normal workup at this time.  Patient will be discharged home with instructions to take his home meds as prescribed. Patient is to follow up with his PCP or specialist as discussed, as needed or otherwise directed. Patient is given ED precautions to return to the ED for any worsening or new symptoms.   FINAL CLINICAL IMPRESSION(S) / ED DIAGNOSES   Final diagnoses:  Nonspecific chest pain     Rx / DC Orders   ED Discharge Orders     None        Note:  This document was prepared using Dragon voice recognition software and may include unintentional dictation errors.    Loyd Candida LULLA Aldona, PA-C 05/13/24 1833    Clarine Ozell LABOR, MD 05/13/24 [phone]

## 2024-05-13 NOTE — Discharge Instructions (Addendum)
 Your exam, labs, EKG, and chest x-ray are all normal and reassuring at this time.  No signs of any serious underlying abnormalities or concerns for acute myocardial infarction.  The exact cause also source of your chest pain is unclear at this time.  No x-ray evidence of any pneumonia or bronchitis.  Troponin is unchanged in serial testing.  You should follow-up with your primary provider or cardiologist as suggested.  Return to the ED if necessary.

## 2024-05-23 ENCOUNTER — Ambulatory Visit: Attending: Cardiology | Admitting: Cardiology

## 2024-05-23 ENCOUNTER — Encounter: Payer: Self-pay | Admitting: Cardiology

## 2024-05-23 VITALS — BP 140/88 | HR 55 | Ht 74.0 in | Wt 231.4 lb

## 2024-05-23 DIAGNOSIS — I25119 Atherosclerotic heart disease of native coronary artery with unspecified angina pectoris: Secondary | ICD-10-CM

## 2024-05-23 DIAGNOSIS — E782 Mixed hyperlipidemia: Secondary | ICD-10-CM

## 2024-05-23 DIAGNOSIS — R072 Precordial pain: Secondary | ICD-10-CM | POA: Diagnosis not present

## 2024-05-23 DIAGNOSIS — D6851 Activated protein C resistance: Secondary | ICD-10-CM | POA: Diagnosis not present

## 2024-05-23 DIAGNOSIS — Z86711 Personal history of pulmonary embolism: Secondary | ICD-10-CM

## 2024-05-23 DIAGNOSIS — Z86718 Personal history of other venous thrombosis and embolism: Secondary | ICD-10-CM

## 2024-05-23 DIAGNOSIS — R001 Bradycardia, unspecified: Secondary | ICD-10-CM

## 2024-05-23 MED ORDER — NITROGLYCERIN 0.4 MG SL SUBL: 0.4000 mg | SUBLINGUAL_TABLET | SUBLINGUAL | 3 refills | Status: DC | PRN

## 2024-05-23 MED ORDER — AMLODIPINE BESYLATE 2.5 MG PO TABS: 2.5000 mg | ORAL_TABLET | Freq: Every day | ORAL | 3 refills | Status: AC

## 2024-05-23 MED ORDER — ROSUVASTATIN CALCIUM 40 MG PO TABS: 40.0000 mg | ORAL_TABLET | Freq: Every day | ORAL | 3 refills | Status: AC

## 2024-05-23 NOTE — Patient Instructions (Signed)
 Medication Instructions:  Your physician recommends the following medication changes.  START TAKING: Amlodipine  2.5 mg daily Nitroglycerine 0.4 sublingual as needed  *If you need a refill on your cardiac medications before your next appointment, please call your pharmacy*  Lab Work: No labs ordered today  If you have labs (blood work) drawn today and your tests are completely normal, you will receive your results only by: MyChart Message (if you have MyChart) OR A paper copy in the mail If you have any lab test that is abnormal or we need to change your treatment, we will call you to review the results.  Testing/Procedures:    Please report to Radiology at the [facility] Main Entrance 30 minutes early for your test.  [address] [place], [place] [address]                         OR   Please report to Radiology at [facility] Main Entrance, medical mall, 30 mins prior to your test.  [address]  [place], [place]  How to Prepare for Your Cardiac PET/CT Stress Test:  Nothing to eat or drink, except water, 3 hours prior to arrival time.  NO caffeine/decaffeinated products, or chocolate 12 hours prior to arrival. (Please note decaffeinated beverages (teas/coffees) still contain caffeine).  If you have caffeine within 12 hours prior, the test will need to be rescheduled.  Medication instructions: Do not take erectile dysfunction medications for 72 hours prior to test (sildenafil, tadalafil) Do not take nitrates (isosorbide mononitrate, Ranexa) the day before or day of test Do not take tamsulosin the day before or morning of test Hold theophylline containing medications for 12 hours. Hold Dipyridamole 48 hours prior to the test.  Diabetic Preparation: If able to eat breakfast prior to 3 hour fasting, you may take all medications, including your insulin. Do not worry if you miss your breakfast dose of insulin - start at your next  meal. If you do not eat prior to 3 hour fast-Hold all diabetes (oral and insulin) medications. Patients who wear a continuous glucose monitor MUST remove the device prior to scanning.  You may take your remaining medications with water.  NO perfume, cologne or lotion on chest or abdomen area. FEMALES - Please avoid wearing dresses to this appointment.  Total time is 1 to 2 hours; you may want to bring reading material for the waiting time.  IF YOU THINK YOU MAY BE PREGNANT, OR ARE NURSING PLEASE INFORM THE TECHNOLOGIST.  In preparation for your appointment, medication and supplies will be purchased.  Appointment availability is limited, so if you need to cancel or reschedule, please call the Radiology Department Scheduler at [phone] 24 hours in advance to avoid a cancellation fee of $100.00  What to Expect When you Arrive:  Once you arrive and check in for your appointment, you will be taken to a preparation room within the Radiology Department.  A technologist or Nurse will obtain your medical history, verify that you are correctly prepped for the exam, and explain the procedure.  Afterwards, an IV will be started in your arm and electrodes will be placed on your skin for EKG monitoring during the stress portion of the exam. Then you will be escorted to the PET/CT scanner.  There, staff will get you positioned on the scanner and obtain a blood pressure and EKG.  During the exam, you will continue to be connected to  the EKG and blood pressure machines.  A small, safe amount of a radioactive tracer will be injected in your IV to obtain a series of pictures of your heart along with an injection of a stress agent.    After your Exam:  It is recommended that you eat a meal and drink a caffeinated beverage to counter act any effects of the stress agent.  Drink plenty of fluids for the remainder of the day and urinate frequently for the first couple of hours after the exam.  Your doctor will  inform you of your test results within 7-10 business days.  For more information and frequently asked questions, please visit our website: [email or web address]  For questions about your test or how to prepare for your test, please call: Cardiac Imaging Nurse Navigators Office: [phone]   Follow-Up: At [facility], you and your health needs are our priority.  As part of our continuing mission to provide you with exceptional heart care, our providers are all part of one team.  This team includes your primary Cardiologist (physician) and Advanced Practice Providers or APPs (Physician Assistants and Nurse Practitioners) who all work together to provide you with the care you need, when you need it.  Your next appointment:   2 month(s)  Provider:   You may see Timothy Gollan, MD or one of the following Advanced Practice Providers on your designated Care Team:   Tylene Lunch, NP

## 2024-05-23 NOTE — Progress Notes (Signed)
 Cardiology Office Note   Date:  05/23/2024  ID:  Wesam Gearhart, DOB [birth year], MRN [id number] PCP: Glover Lenis, MD  [facility] Providers Cardiologist:  Evalene Lunger, MD Cardiology APP:  Gerard Frederick, NP     History of Present Illness Abimelec Grochowski is a 63 y.o. male with a past medical history of coronary disease, PE (01/2022 status post pulmonary thrombectomy on apixaban  for factor V Leiden deficiency, hyperlipidemia, who presents today after recent evaluation in the emergency department.   He was admitted to the hospital 09/2021 with submassive saddle PE and underwent pulmonary thrombectomy on 09/21/2021.  High-sensitivity troponin peaked at 1698.  In hospital follow-up on 09/27/21 was feeling better.  Coronary CTA 09/26/2021 showed persistent large clot burden bilaterally with early subacute pulmonary emboli with decreased clot burden 11/28/2021.  Ill-defined nodular opacity in the peripheral basilar left lower lobe was not substantially changed and favored involving pulmonary infarct.  Calcium  score of 0 with 99% 200% noncalcified plaque causing severe mid LAD stenosis.  Given coronary CTA findings, he underwent left heart catheterization 10/04/2021 which showed severe single-vessel CAD with CTO of the mid LAD.  The distal LAD was supplied by robust right to left collaterals.  There was mild to moderate nonobstructive disease otherwise of the proximal LAD, left circumflex, and RCA.  Normal LVEDP, medical therapy was recommended with suspicion for the patient's fatigue and dyspnea as well as elevated troponin felt to be due to submassive PE and demand ischemia.  Echocardiogram 10/13/2021 showed an EF of 60 to 65%, no RWMA, indeterminate LV diastolic function parameters, normal RV systolic function and ventricular cavity size, normal PASP estimated 21.3 mmHg, no significant valvular abnormalities.  Evaluated in clinic 01/31/2022 was overall doing well from a cardiac perspective.   Vague chest tightness not needing sublingual nitroglycerin  but reported leg cramps.  DVT study was ordered which resulted as negative.  He was last seen in clinic 11/15/2023 doing well from a cardiac perspective.  He had been compliant with his current medication regimen without any undue side effects.  Has not had any issues with his apixaban .  There were no medication changes that were made or further testing that was ordered.  He was evaluated in the [facility] emergency department 05/13/2024 for complaints of chest pain.  He reported onset of symptoms earlier in the day he stated that lasted approximately 10 minutes.  Denied any associated symptoms.  Blood pressure was elevated at 140/91.  Chest x-ray was unrevealing.  He had a reassuring cardiac workup.  High-sensitivity troponins were flat x 2.    He returns clinic today stating that he has been doing fairly well from a cardiac perspective since his recent emergency department visit.  He stated that he had a tightness in his chest but came on all of a sudden and the residual lasted for several days.  Workup in the emergency department was unrevealing but he has noted since that time continued slight uptick in blood pressure and continued chronic fatigue.  He denies any other associated symptoms or recurrence of the chest discomfort.  He states that he has been compliant with his current medication regimen without any undue side effects.  He is continued on his apixaban  5 mg twice daily without issues of bleeding with no blood noted in the urine or stool.  ROS: 10 point review of systems has been reviewed and considered negative the exception was been listed in the HPI  Studies Reviewed EKG Interpretation Date/Time:  Friday May 23 2024 08:42:01 EST Ventricular Rate:  55 PR Interval:  200 QRS Duration:  88 QT Interval:  400 QTC Calculation: 382 R Axis:   -8  Text Interpretation: Sinus bradycardia Inferior infarct (cited on or before  13-May-2024) When compared with ECG of 13-May-2024 15:13, No significant change was found Confirmed by Gerard Frederick ([id number]) on 05/23/2024 8:48:19 AM    2D echo 10/13/2021 1. Left ventricular ejection fraction, by estimation, is 60 to 65%. The  left ventricle has normal function. The left ventricle has no regional  wall motion abnormalities. Left ventricular diastolic parameters are  indeterminate.   2. Right ventricular systolic function is normal. The right ventricular  size is normal. There is normal pulmonary artery systolic pressure. The  estimated right ventricular systolic pressure is 21.3 mmHg.   3. The mitral valve is normal in structure. No evidence of mitral valve  regurgitation. No evidence of mitral stenosis.   4. The aortic valve is normal in structure. Aortic valve regurgitation is  not visualized. No aortic stenosis is present.   5. The inferior vena cava is normal in size with greater than 50%  respiratory variability, suggesting right atrial pressure of 3 mmHg.    LHC 10/04/2021 Conclusions: Severe single-vessel coronary artery disease with chronic total occlusion of the mid LAD.  Distal LAD is supplied by robust right-to-left collaterals. Mild-moderate, nonobstructive coronary artery disease involving the proximal LAD, LCx, and RCA. Normal left ventricular filling pressure (LVEDP 10 mmHg).   Recommendations: Continue medical therapy.  I suspect recent dyspnea/fatigue and elevated troponin are due to submassive pulmonary embolism and demand ischemia. Aggressive secondary prevention of coronary artery disease.  Recommend adding statin therapy to target LDL less than 70. Restart apixaban  5 mg twice daily this evening if no evidence of bleeding or vascular injury at right radial catheterization site.   cCTA 09/26/2021 IMPRESSION: 1. Coronary calcium  score of 0.   2. Normal coronary origin with right dominance.   3. Non calcified plaque causing severe mid LAD stenosis  (99-100%)   4. Bilateral filling defects noted in the pulmonary arteries suggesting pulmonary embolus (PE). Recommend dedicated CTA chest PE protocol for further eval.   5. CAD-RADS 4 Severe stenosis. (70-99% or > 50% left main). Cardiac catheterization is recommended. Consider symptom-guided anti-ischemic pharmacotherapy as well as risk factor modification per guideline directed care.  Risk Assessment/Calculations   HYPERTENSION CONTROL Vitals:   05/23/24 0837 05/23/24 0845  BP: (!) 140/92 (!) 140/88    The patient's blood pressure is elevated above target today.  In order to address the patient's elevated BP: A new medication was prescribed today.          Physical Exam VS:  BP (!) 140/88 (BP Location: Left Arm, Patient Position: Sitting, Cuff Size: Normal)   Pulse (!) 55   Ht 6' 2 (1.88 m)   Wt 231 lb 6.4 oz (105 kg)   SpO2 98%   BMI 29.71 kg/m        Wt Readings from Last 3 Encounters:  05/23/24 231 lb 6.4 oz (105 kg)  05/13/24 225 lb (102.1 kg)  11/15/23 229 lb 6.4 oz (104.1 kg)    GEN: Well nourished, well developed in no acute distress NECK: No JVD; No carotid bruits CARDIAC: RRR, bradycardic, no murmurs, rubs, gallops RESPIRATORY:  Clear to auscultation without rales, wheezing or rhonchi  ABDOMEN: Soft, non-tender, non-distended EXTREMITIES:  No edema; No deformity   ASSESSMENT AND PLAN Precordial pain with a history of coronary artery disease  with recent evaluation in the emergency department.  High-sensitivity troponins in the emergency department were negative.  EKG today reveals sinus bradycardia with rate of 55 with possible inferior infarct noted on 11/25.  Last heart catheterization was completed in 09/2021 showing one-vessel CAD with CTO of the LAD with collaterals and otherwise mild to moderate nonobstructive CAD and was recommended for medical management.  With his recent emergency department visit and other findings on his cath of nonobstructive  disease in several arteries he has been scheduled for a cardiac PET stress to rule out ischemic causes.  He is also being started on amlodipine  2.5 mg daily for antianginal effect and given Nitrostat  0.4 mg sublingual with directions giving him an appropriate use of the medication as well as side effects.  Mixed hyperlipidemia with last LDL of 30.  He is continued on simvastatin 40 mg daily.  Recommend repeating lipid panel on return.  History of PE status post pulmonary thrombectomy with factor V Leiden mutation where he is on chronic apixaban  5 mg twice daily without concerns of bleeding.  Continues to follow with hematology.  Sinus bradycardia noted on EKG today with a rate of 55.  He remains off of beta-blocker therapy due to baseline bradycardia.  He is chronotropically appropriate as his heart rate is increased with activity.  He is asymptomatic.  Will continue to avoid AV nodal blocking agents and continue to monitor with surveillance studies.    Informed Consent   Shared Decision Making/Informed Consent The risks [chest pain, shortness of breath, cardiac arrhythmias, dizziness, blood pressure fluctuations, myocardial infarction, stroke/transient ischemic attack, nausea, vomiting, allergic reaction, radiation exposure, metallic taste sensation and life-threatening complications (estimated to be 1 in 10,000)], benefits (risk stratification, diagnosing coronary artery disease, treatment guidance) and alternatives of a cardiac PET stress test were discussed in detail with Mr. Ribas and he agrees to proceed.     Dispo: Patient to return to clinic to see MD/APP in 8 weeks or sooner if needed for reevaluation after testing is completed  Signed, Tiphanie Vo, NP

## 2024-06-24 ENCOUNTER — Encounter (HOSPITAL_COMMUNITY): Payer: Self-pay

## 2024-06-26 ENCOUNTER — Ambulatory Visit: Payer: Self-pay | Admitting: Cardiology

## 2024-06-26 ENCOUNTER — Ambulatory Visit
Admission: RE | Admit: 2024-06-26 | Discharge: 2024-06-26 | Disposition: A | Source: Ambulatory Visit | Attending: Cardiology | Admitting: Cardiology

## 2024-06-26 DIAGNOSIS — R079 Chest pain, unspecified: Secondary | ICD-10-CM | POA: Insufficient documentation

## 2024-06-26 DIAGNOSIS — R072 Precordial pain: Secondary | ICD-10-CM

## 2024-06-26 LAB — NM PET CT CARDIAC PERFUSION MULTI W/ABSOLUTE BLOODFLOW
MBFR: 3.2
Nuc Rest EF: 55 %
Nuc Stress EF: 67 %
Peak HR: 75 {beats}/min
Rest HR: 57 {beats}/min
Rest MBF: 0.5 ml/g/min
Rest Nuclear Isotope Dose: 25 mCi
SRS: 0
SSS: 0
ST Depression (mm): 0 mm
Stress MBF: 1.6 ml/g/min
Stress Nuclear Isotope Dose: 24.9 mCi
TID: 1.04

## 2024-06-26 MED ORDER — RUBIDIUM RB82 GENERATOR (RUBYFILL)
25.0000 | PACK | Freq: Once | INTRAVENOUS | Status: AC
  Administered 2024-06-26: 24.95 via INTRAVENOUS

## 2024-06-26 MED ORDER — REGADENOSON 0.4 MG/5ML IV SOLN
0.4000 mg | Freq: Once | INTRAVENOUS | Status: AC
  Administered 2024-06-26: 0.4 mg via INTRAVENOUS
  Filled 2024-06-26: qty 5

## 2024-06-26 MED ORDER — REGADENOSON 0.4 MG/5ML IV SOLN
INTRAVENOUS | Status: AC
  Filled 2024-06-26: qty 5

## 2024-06-26 MED ORDER — RUBIDIUM RB82 GENERATOR (RUBYFILL)
25.0000 | PACK | Freq: Once | INTRAVENOUS | Status: AC
  Administered 2024-06-26: 24.92 via INTRAVENOUS

## 2024-07-16 NOTE — Telephone Encounter (Signed)
 There are no medication interactions between the two medications of amlodipine  and propanolol. Recommend close monitoring of blood pressure as it can cause a decrease in blood pressure and heart rate. Take one in the morning and one in the evening may prevent a notable drop. Propanolol can also cause a decrease in your heart rate if this is noted when taking you vitals at home call your neurologist for dose /medication adjustments related to the prescribed therapy.

## 2024-07-18 NOTE — Telephone Encounter (Signed)
 Called patient - denied all symptoms, please advise on any treatment change due to low HR

## 2024-07-25 ENCOUNTER — Ambulatory Visit: Admitting: Cardiology

## 2024-07-25 ENCOUNTER — Encounter: Payer: Self-pay | Admitting: Cardiology

## 2024-07-25 VITALS — BP 130/68 | HR 60 | Ht 74.0 in | Wt 233.4 lb

## 2024-07-25 DIAGNOSIS — E782 Mixed hyperlipidemia: Secondary | ICD-10-CM

## 2024-07-25 DIAGNOSIS — I25119 Atherosclerotic heart disease of native coronary artery with unspecified angina pectoris: Secondary | ICD-10-CM

## 2024-07-25 DIAGNOSIS — Z86711 Personal history of pulmonary embolism: Secondary | ICD-10-CM

## 2024-07-25 DIAGNOSIS — R001 Bradycardia, unspecified: Secondary | ICD-10-CM

## 2024-07-25 DIAGNOSIS — D6851 Activated protein C resistance: Secondary | ICD-10-CM

## 2024-07-25 MED ORDER — NITROGLYCERIN 0.4 MG SL SUBL: 0.4000 mg | SUBLINGUAL_TABLET | SUBLINGUAL | Status: AC | PRN

## 2024-07-25 NOTE — Patient Instructions (Signed)
 Medication Instructions:   Your physician recommends that you continue on your current medications as directed. Please refer to the Current Medication list given to you today.    *If you need a refill on your cardiac medications before your next appointment, please call your pharmacy*  Lab Work:  None ordered at this time   If you have labs (blood work) drawn today and your tests are completely normal, you will receive your results only by:  MyChart Message (if you have MyChart) OR  A paper copy in the mail If you have any lab test that is abnormal or we need to change your treatment, we will call you to review the results.  Testing/Procedures:  None ordered at this time   Referrals:  None ordered at this time   Follow-Up:  At [facility], you and your health needs are our priority.  As part of our continuing mission to provide you with exceptional heart care, our providers are all part of one team.  This team includes your primary Cardiologist (physician) and Advanced Practice Providers or APPs (Physician Assistants and Nurse Practitioners) who all work together to provide you with the care you need, when you need it.  Your next appointment:   5 - 6 month(s)  Provider:    Evalene Lunger, MD or Tylene Lunch, NP    We recommend signing up for the patient portal called MyChart.  Sign up information is provided on this After Visit Summary.  MyChart is used to connect with patients for Virtual Visits (Telemedicine).  Patients are able to view lab/test results, encounter notes, upcoming appointments, etc.  Non-urgent messages can be sent to your provider as well.   To learn more about what you can do with MyChart, go to [email or web address].

## 2024-07-25 NOTE — Progress Notes (Signed)
 " Cardiology Office Note   Date:  07/25/2024  ID:  Roberto Pope, DOB 31-Jul-1960, MRN [id number] PCP: Glover Lenis, MD  [facility] Providers Cardiologist:  Evalene Lunger, MD Cardiology APP:  Gerard Frederick, NP     History of Present Illness Roberto Pope is a 64 y.o. male with past medical history of coronary artery disease, PE (01/2022) status post pulmonary thrombectomy on apixaban  for factor V Leiden deficiency, hyperlipidemia, who presents today for follow-up.   He was admitted to the hospital 09/2021 with submassive saddle PE and underwent pulmonary thrombectomy on 09/21/2021.  High-sensitivity troponin peaked at 1698.  In hospital follow-up on 09/27/21 was feeling better.  Coronary CTA 09/26/2021 showed persistent large clot burden bilaterally with early subacute pulmonary emboli with decreased clot burden 11/28/2021.  Ill-defined nodular opacity in the peripheral basilar left lower lobe was not substantially changed and favored involving pulmonary infarct.  Calcium  score of 0 with 99% 200% noncalcified plaque causing severe mid LAD stenosis.  Given coronary CTA findings, he underwent left heart catheterization 10/04/2021 which showed severe single-vessel CAD with CTO of the mid LAD.  The distal LAD was supplied by robust right to left collaterals.  There was mild to moderate nonobstructive disease otherwise of the proximal LAD, left circumflex, and RCA.  Normal LVEDP, medical therapy was recommended with suspicion for the patient's fatigue and dyspnea as well as elevated troponin felt to be due to submassive PE and demand ischemia.  Echocardiogram 10/13/2021 showed an EF of 60 to 65%, no RWMA, indeterminate LV diastolic function parameters, normal RV systolic function and ventricular cavity size, normal PASP estimated 21.3 mmHg, no significant valvular abnormalities.  Evaluated in clinic 01/31/2022 was overall doing well from a cardiac perspective.  Vague chest tightness not needing  sublingual nitroglycerin  but reported leg cramps.  DVT study was ordered which resulted as negative.  He was last seen in clinic 11/15/2023 doing well from a cardiac perspective.  He had been compliant with his current medication regimen without any undue side effects.  Has not had any issues with his apixaban .  There were no medication changes that were made or further testing that was ordered.  He was evaluated in the [facility] emergency department 05/13/2024 for complaints of chest pain.  He reported onset of symptoms earlier in the day he stated that lasted approximately 10 minutes.  Denied any associated symptoms.  Blood pressure was elevated at 140/91.  Chest x-ray was unrevealing.  He had a reassuring cardiac workup.  High-sensitivity troponins were flat x 2.   He was last seen in clinic 05/25/2024 send he was doing fairly well from a cardiac perspective since his recent emergency department visit.  He states that he had tightness in chest that came on all of a sudden and the residual lasted for several days.  Workup in the emergency department was unrevealing.  He was scheduled for cardiac PET stress to rule out any ischemic causes of his chest discomfort.   He returns to clinic today stating that he has been doing well from a cardiac perspective.  States that he continues to have occasional chest tightness with rest and exertion randomly that is relieved spontaneously and is without nitro.  Recent stress testing was considered low risk.  He does have essential tremors when neurology recently placed him on propranolol 60 mg daily.  Unfortunately with the dosing he was placed on he had bradycardia with heart rates into the low 40s.  The propranolol was recently discontinued by  his PCP and the remainder of his medications have remained the same.  He has not missed any doses of his apixaban  and denies any bleeding with the blood noted in his urine or stool.  Denies any hospitalizations or visits to the  emergency department.  ROS: 10 point review of system has been reviewed and considered negative the exception was been listed in the HPI  Studies Reviewed     EKG today revealed sinus pericardia with a rate of 56 with no acute ischemic changes noted from prior studies.  cPET stress 06/26/2024   The study is normal. The study is low risk.   LV perfusion is normal. There is no evidence of ischemia. There is no evidence of infarction.   Rest left ventricular function is normal. Rest EF: 55%. Stress left ventricular function is normal. Stress EF: 67%. End diastolic cavity size is normal. End systolic cavity size is normal. No evidence of transient ischemic dilation (TID) noted.   Myocardial blood flow was computed to be 0.50ml/g/min at rest and 1.60ml/g/min at stress. Global myocardial blood flow reserve was 3.20 and was normal.   Coronary calcium  was present on the attenuation correction CT images. Minimal coronary calcifications were present. Coronary calcifications were present in the left anterior descending artery distribution(s).  2D echo 10/13/2021 1. Left ventricular ejection fraction, by estimation, is 60 to 65%. The  left ventricle has normal function. The left ventricle has no regional  wall motion abnormalities. Left ventricular diastolic parameters are  indeterminate.   2. Right ventricular systolic function is normal. The right ventricular  size is normal. There is normal pulmonary artery systolic pressure. The  estimated right ventricular systolic pressure is 21.3 mmHg.   3. The mitral valve is normal in structure. No evidence of mitral valve  regurgitation. No evidence of mitral stenosis.   4. The aortic valve is normal in structure. Aortic valve regurgitation is  not visualized. No aortic stenosis is present.   5. The inferior vena cava is normal in size with greater than 50%  respiratory variability, suggesting right atrial pressure of 3 mmHg.    LHC  10/04/2021 Conclusions: Severe single-vessel coronary artery disease with chronic total occlusion of the mid LAD.  Distal LAD is supplied by robust right-to-left collaterals. Mild-moderate, nonobstructive coronary artery disease involving the proximal LAD, LCx, and RCA. Normal left ventricular filling pressure (LVEDP 10 mmHg).   Recommendations: Continue medical therapy.  I suspect recent dyspnea/fatigue and elevated troponin are due to submassive pulmonary embolism and demand ischemia. Aggressive secondary prevention of coronary artery disease.  Recommend adding statin therapy to target LDL less than 70. Restart apixaban  5 mg twice daily this evening if no evidence of bleeding or vascular injury at right radial catheterization site.   cCTA 09/26/2021 IMPRESSION: 1. Coronary calcium  score of 0.   2. Normal coronary origin with right dominance.   3. Non calcified plaque causing severe mid LAD stenosis (99-100%)   4. Bilateral filling defects noted in the pulmonary arteries suggesting pulmonary embolus (PE). Recommend dedicated CTA chest PE protocol for further eval.   5. CAD-RADS 4 Severe stenosis. (70-99% or > 50% left main). Cardiac catheterization is recommended. Consider symptom-guided anti-ischemic pharmacotherapy as well as risk factor modification per guideline directed care.  Risk Assessment/Calculations           Physical Exam VS:  BP 130/68 (BP Location: Left Arm, Patient Position: Sitting, Cuff Size: Normal)   Pulse 60   Ht 6' 2 (1.88 m)  Wt 233 lb 6.4 oz (105.9 kg)   SpO2 97%   BMI 29.97 kg/m        Wt Readings from Last 3 Encounters:  07/25/24 233 lb 6.4 oz (105.9 kg)  05/23/24 231 lb 6.4 oz (105 kg)  05/13/24 225 lb (102.1 kg)    GEN: Well nourished, well developed in no acute distress NECK: No JVD; No carotid bruits CARDIAC: RRR, no murmurs, rubs, gallops RESPIRATORY:  Clear to auscultation without rales, wheezing or rhonchi  ABDOMEN: Soft, non-tender,  non-distended EXTREMITIES:  No edema; No deformity   ASSESSMENT AND PLAN Coronary artery disease with previous left heart catheterization in 09/2021 showing one-vessel CAD with CTO of the LAD with collaterals and otherwise mild to moderate nonobstructive CAD was recommended for medical management.  He recently underwent cardiac PET stress testing which was considered low risk with no ischemia or infarct and normal myocardial blood flow.  He is continued on amlodipine  2.5 mg daily for antianginal effect as well as apixaban  and lieu of aspirin  and rosuvastatin  40 mg daily.  He also has Nitrostat  0.4 mg sublingual as needed but has not needed any of the nitro and is requesting that medication be taken off of automatic refills.  Mixed hyperlipidemia with last LDL of 73.  Goal is 70 or less.  Previously was on ezetimibe  but that he self discontinued due to side effects.  He will continue to work on his diet and activity to meet the goal of less than 70.  He has been continued on rosuvastatin  40 mg daily.  History of PE status post pulmonary thrombectomy with factor V Leiden mutation result chronic apixaban  5 mg twice daily without issues of bleeding.  Ongoing management per hematology.  Sinus bradycardia noted on EKG today with heart rate today of 56.  No acute ischemic changes noted.  It is recommended to avoid AV nodal blocking agents as with propranolol he had significantly reduced heart rate into the upper 30s and low 40s.  Ongoing management with surveillance studies.       Dispo: Patient to return to clinic to see MD/APP in 6 months or sooner if needed for further evaluation.  Signed, Azaylea Maves, NP   "
